# Patient Record
Sex: Male | Born: 1950
Health system: Southern US, Community
[De-identification: ages and names within clinical notes are randomized; demographics above are authoritative.]

## PROBLEM LIST (undated history)

## (undated) DIAGNOSIS — B1920 Unspecified viral hepatitis C without hepatic coma: Secondary | ICD-10-CM

---

## 1998-12-27 ENCOUNTER — Ambulatory Visit (HOSPITAL_COMMUNITY): Admission: RE | Admit: 1998-12-27 | Discharge: 1998-12-27 | Payer: Self-pay | Admitting: Gastroenterology

## 1998-12-27 ENCOUNTER — Encounter: Payer: Self-pay | Admitting: Gastroenterology

## 2000-06-03 ENCOUNTER — Other Ambulatory Visit: Admission: RE | Admit: 2000-06-03 | Discharge: 2000-06-03 | Payer: Self-pay | Admitting: Gastroenterology

## 2008-07-02 ENCOUNTER — Telehealth: Payer: Self-pay | Admitting: Gastroenterology

## 2010-09-26 ENCOUNTER — Other Ambulatory Visit: Payer: Self-pay | Admitting: Family Medicine

## 2010-09-26 DIAGNOSIS — R079 Chest pain, unspecified: Secondary | ICD-10-CM

## 2010-09-28 ENCOUNTER — Ambulatory Visit
Admission: RE | Admit: 2010-09-28 | Discharge: 2010-09-28 | Disposition: A | Discharge status: Home / Self Care | Payer: No Typology Code available for payment source | Source: Ambulatory Visit | Attending: Family Medicine | Admitting: Family Medicine

## 2010-09-28 DIAGNOSIS — R079 Chest pain, unspecified: Secondary | ICD-10-CM

## 2010-09-28 MED ORDER — IOHEXOL 300 MG/ML  SOLN
100.0000 mL | Freq: Once | INTRAMUSCULAR | Status: AC | PRN
  Administered 2010-09-28: 100 mL via INTRAVENOUS

## 2012-05-07 HISTORY — PX: SHOULDER ARTHROSCOPY WITH SUBACROMIAL DECOMPRESSION: SHX5684

## 2015-02-28 DIAGNOSIS — M7710 Lateral epicondylitis, unspecified elbow: Secondary | ICD-10-CM | POA: Diagnosis not present

## 2015-02-28 DIAGNOSIS — Z23 Encounter for immunization: Secondary | ICD-10-CM | POA: Diagnosis not present

## 2015-02-28 DIAGNOSIS — B1920 Unspecified viral hepatitis C without hepatic coma: Secondary | ICD-10-CM | POA: Diagnosis not present

## 2015-02-28 DIAGNOSIS — R252 Cramp and spasm: Secondary | ICD-10-CM | POA: Diagnosis not present

## 2015-02-28 DIAGNOSIS — M2392 Unspecified internal derangement of left knee: Secondary | ICD-10-CM | POA: Diagnosis not present

## 2015-02-28 DIAGNOSIS — H578 Other specified disorders of eye and adnexa: Secondary | ICD-10-CM | POA: Diagnosis not present

## 2015-03-01 ENCOUNTER — Other Ambulatory Visit (HOSPITAL_COMMUNITY): Payer: Self-pay | Admitting: Gastroenterology

## 2015-03-01 DIAGNOSIS — B1920 Unspecified viral hepatitis C without hepatic coma: Secondary | ICD-10-CM

## 2015-03-07 DIAGNOSIS — M25562 Pain in left knee: Secondary | ICD-10-CM | POA: Diagnosis not present

## 2015-03-07 DIAGNOSIS — M2242 Chondromalacia patellae, left knee: Secondary | ICD-10-CM | POA: Diagnosis not present

## 2015-03-24 ENCOUNTER — Ambulatory Visit (HOSPITAL_COMMUNITY): Payer: Self-pay

## 2015-04-07 ENCOUNTER — Other Ambulatory Visit (HOSPITAL_COMMUNITY): Payer: Self-pay | Admitting: Gastroenterology

## 2015-04-07 DIAGNOSIS — B182 Chronic viral hepatitis C: Secondary | ICD-10-CM

## 2015-04-07 DIAGNOSIS — Z1211 Encounter for screening for malignant neoplasm of colon: Secondary | ICD-10-CM | POA: Diagnosis not present

## 2015-04-07 DIAGNOSIS — B1920 Unspecified viral hepatitis C without hepatic coma: Secondary | ICD-10-CM | POA: Diagnosis not present

## 2015-04-26 DIAGNOSIS — D1801 Hemangioma of skin and subcutaneous tissue: Secondary | ICD-10-CM | POA: Diagnosis not present

## 2015-04-26 DIAGNOSIS — L820 Inflamed seborrheic keratosis: Secondary | ICD-10-CM | POA: Diagnosis not present

## 2015-04-28 ENCOUNTER — Ambulatory Visit (HOSPITAL_COMMUNITY)
Admission: RE | Admit: 2015-04-28 | Discharge: 2015-04-28 | Disposition: A | Discharge status: Home / Self Care | Payer: Medicare Other | Source: Ambulatory Visit | Attending: Gastroenterology | Admitting: Gastroenterology

## 2015-04-28 DIAGNOSIS — B182 Chronic viral hepatitis C: Secondary | ICD-10-CM | POA: Insufficient documentation

## 2015-04-28 DIAGNOSIS — K7689 Other specified diseases of liver: Secondary | ICD-10-CM | POA: Insufficient documentation

## 2015-04-28 DIAGNOSIS — B1920 Unspecified viral hepatitis C without hepatic coma: Secondary | ICD-10-CM | POA: Diagnosis not present

## 2015-04-28 DIAGNOSIS — N281 Cyst of kidney, acquired: Secondary | ICD-10-CM | POA: Insufficient documentation

## 2016-12-05 IMAGING — US US ABDOMEN COMPLETE W/ ELASTOGRAPHY
1 series · 12 of 19 positions shown · non-contrast
Comparison: None.

CLINICAL DATA: Hepatitis C, treated with interferon



[Series 1: us abdomen complete w/ elastography · 0.20mm/px · 12 of 19 slices shown]
[im 1/19]
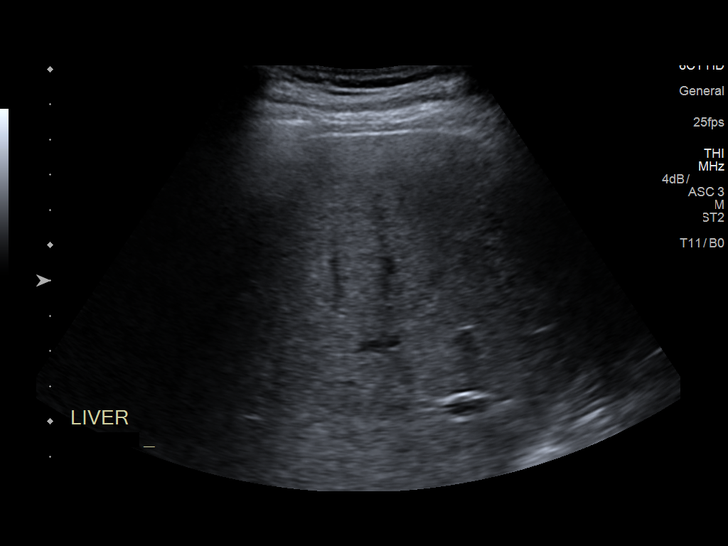
[im 3/19]
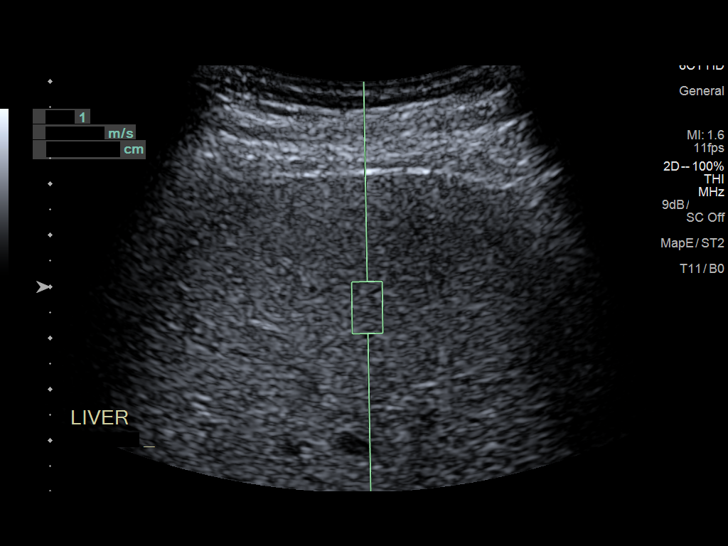
[im 4/19]
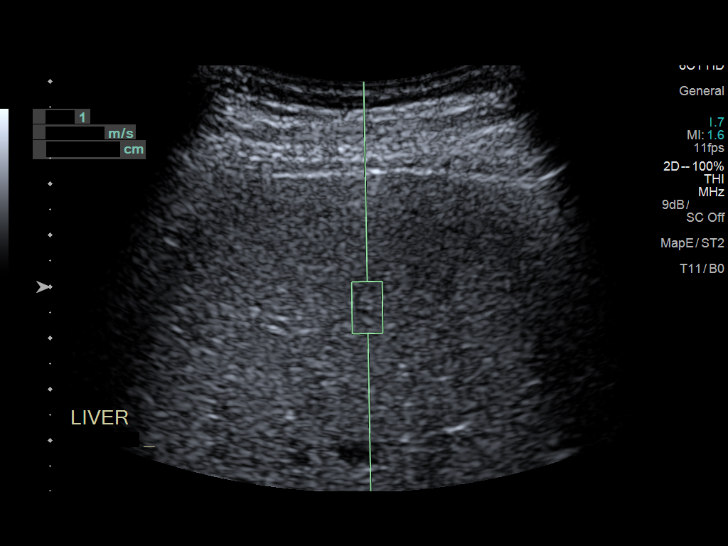
[im 6/19]
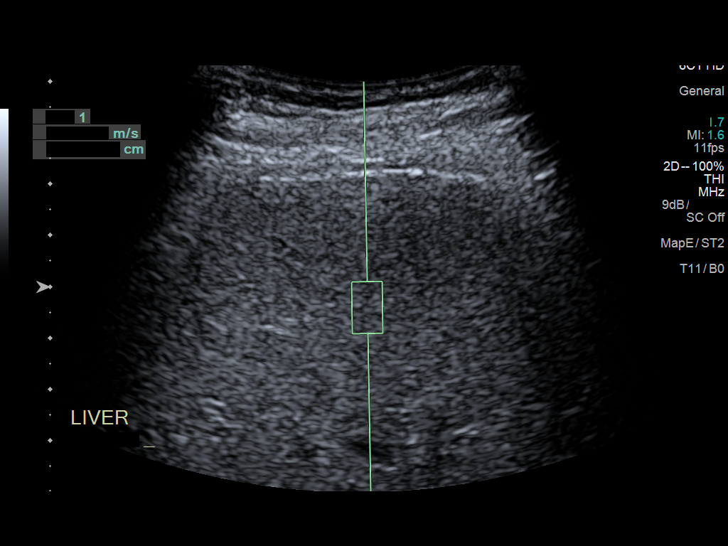
[im 8/19]
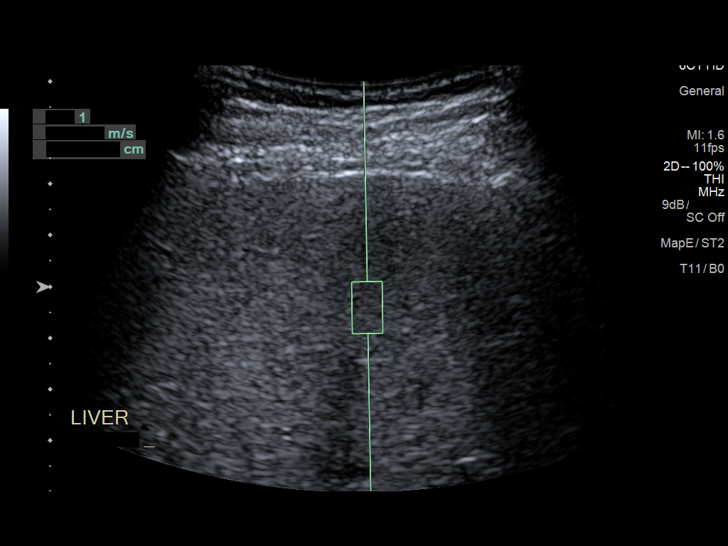
[im 9/19]
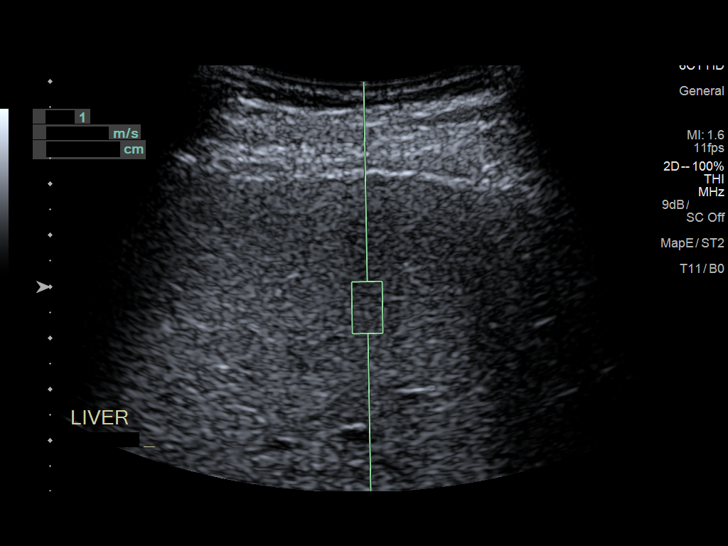
[im 11/19]
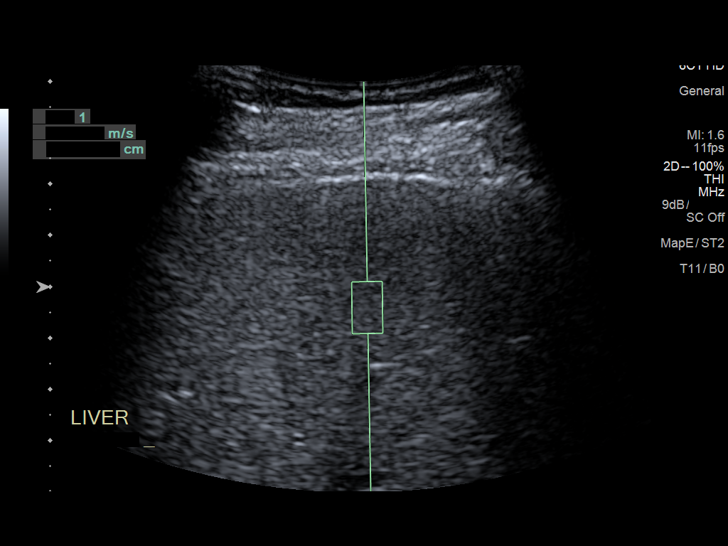
[im 12/19]
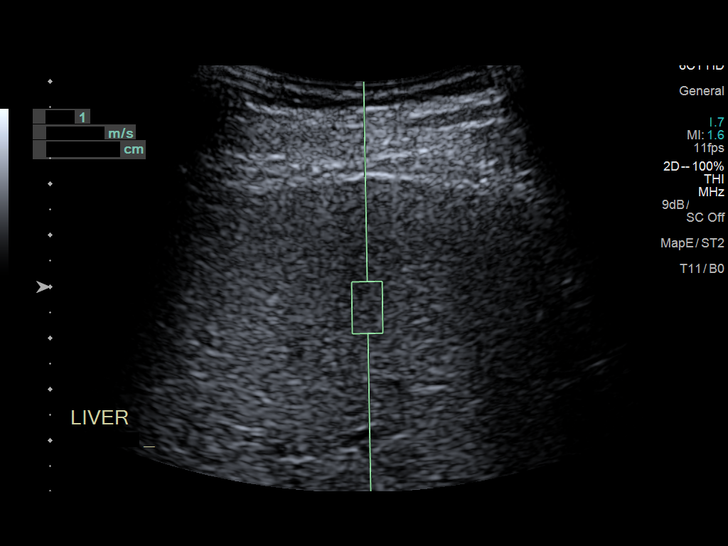
[im 14/19]
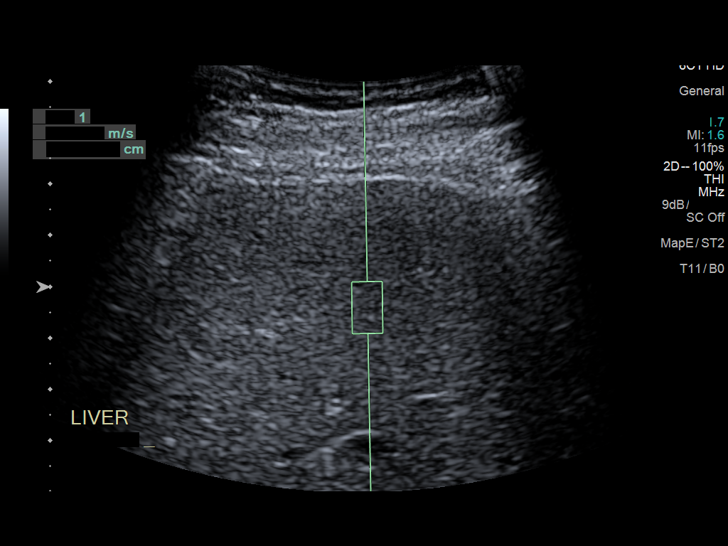
[im 16/19]
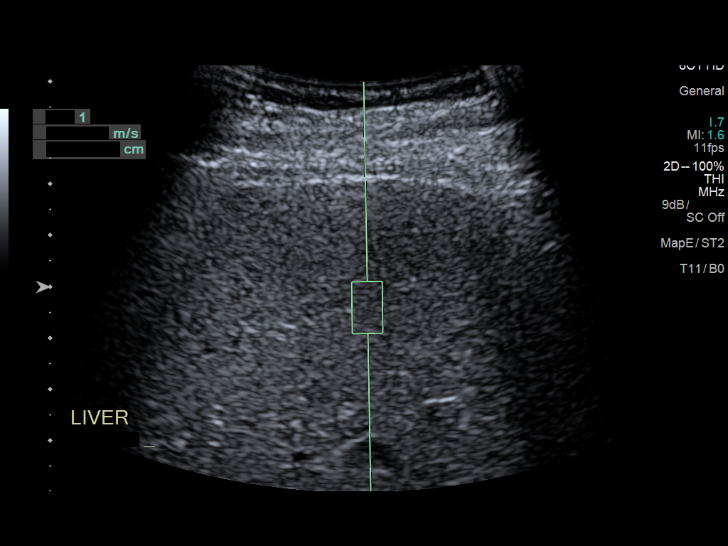
[im 17/19]
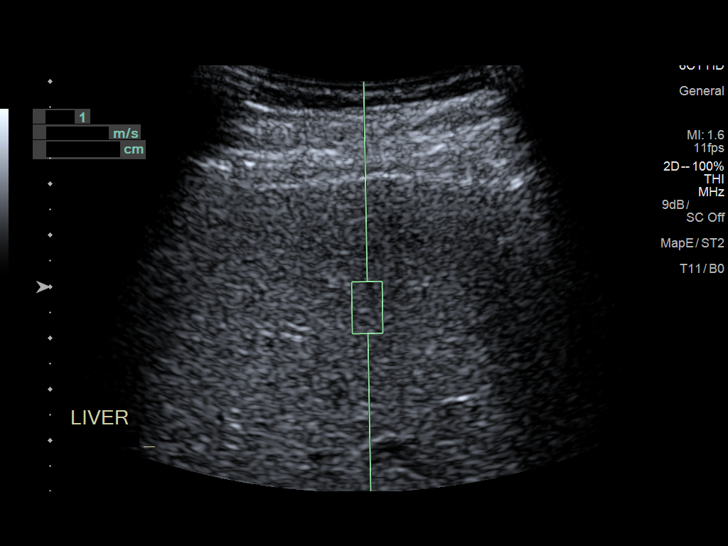
[im 19/19]
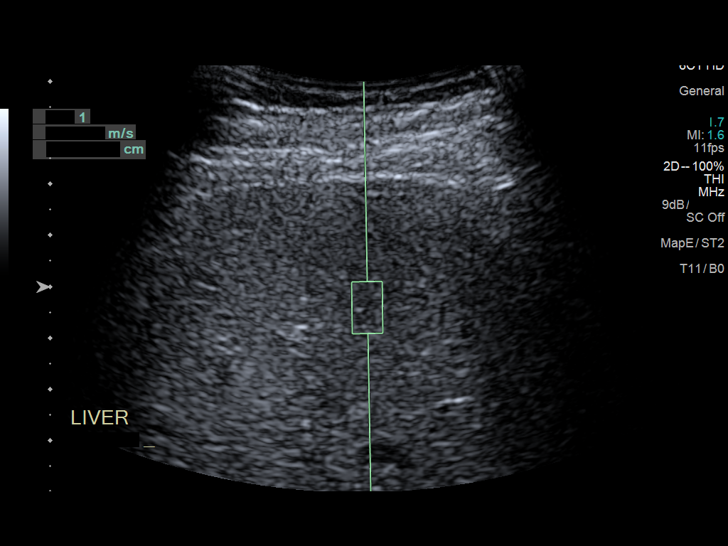

[12 of 19 positions shown; findings below may reference images not displayed]

FINDINGS: ULTRASOUND ABDOMEN

Gallbladder: No gallstones, gallbladder wall thickening, or
pericholecystic fluid. Negative sonographic Murphy's sign.

Common bile duct: Diameter: 5 mm

Liver: Multiple hepatic cysts measuring up to 1.6 cm. 1.1 x 1.0 x
1.1 cm echogenic lesion in the right hepatic lobe, favored to
reflect a benign hemangioma. Coarse hepatic echotexture.

IVC: No abnormality visualized.

Pancreas: Visualized portion unremarkable.

Spleen: Size and appearance within normal limits.

Right Kidney: Length: 12.7 cm. 3.4 x 2.2 x 2.3 cm simple upper pole
renal cyst. No hydronephrosis.

Left Kidney: Length: 13.1 cm. No mass or hydronephrosis.

Abdominal aorta: Mild ectasia proximally, measuring 3.3 cm.

Other findings: None.

ULTRASOUND HEPATIC ELASTOGRAPHY

Device: Siemens Helix VTQ

Patient position: Left lateral decubitus

Transducer 4V1

Number of measurements:  10

Hepatic Segment:  8

Median velocity:   2.30  m/sec

IQR:

IQR/Median velocity ratio

Corresponding Metavir fibrosis score:  F3/F4

Risk of fibrosis: High

Limitations of exam: None

Pertinent findings noted on other imaging exams:  None

Please note that abnormal shear wave velocities may also be
identified in clinical settings other than with hepatic fibrosis,
such as: acute hepatitis, elevated right heart and central venous
pressures including use of beta blockers, Tc Orhan disease
(Mitkness), infiltrative processes such as
mastocytosis/amyloidosis/infiltrative tumor, extrahepatic
cholestasis, in the post-prandial state, and liver transplantation.
Correlation with patient history, laboratory data, and clinical
condition recommended.
IMPRESSION: Coarse hepatic echotexture. Scattered hepatic cysts measuring up to
1.6 cm. 1.1 cm echogenic lesion in the right hepatic lobe, favored
to reflect a benign hemangioma on ultrasound.

3.4 cm simple right upper pole renal cyst.

Possible 3.3 cm abdominal aortic aneurysm. Recommend followup by
ultrasound in 3 years. This recommendation follows ACR consensus
guidelines: White Paper of the ACR Incidental Findings Committee II
on Vascular Findings. [HOSPITAL] 8486; [DATE]

Median hepatic shear wave velocity is calculated at 2.30 m/sec.

Corresponding Metavir fibrosis score is F3/F4.

Risk of fibrosis is high.

Follow-up:  Follow-up advised.

## 2017-08-07 ENCOUNTER — Other Ambulatory Visit (HOSPITAL_BASED_OUTPATIENT_CLINIC_OR_DEPARTMENT_OTHER): Payer: Self-pay | Admitting: Family Medicine

## 2017-08-07 DIAGNOSIS — R1011 Right upper quadrant pain: Secondary | ICD-10-CM

## 2017-08-08 ENCOUNTER — Encounter (HOSPITAL_BASED_OUTPATIENT_CLINIC_OR_DEPARTMENT_OTHER): Payer: Self-pay

## 2017-08-08 ENCOUNTER — Other Ambulatory Visit: Payer: Self-pay | Admitting: Gastroenterology

## 2017-08-08 ENCOUNTER — Ambulatory Visit (HOSPITAL_BASED_OUTPATIENT_CLINIC_OR_DEPARTMENT_OTHER)
Admission: RE | Admit: 2017-08-08 | Discharge: 2017-08-08 | Disposition: A | Discharge status: Home / Self Care | Payer: Medicare Other | Source: Ambulatory Visit | Attending: Family Medicine | Admitting: Family Medicine

## 2017-08-08 DIAGNOSIS — R188 Other ascites: Secondary | ICD-10-CM | POA: Insufficient documentation

## 2017-08-08 DIAGNOSIS — K769 Liver disease, unspecified: Secondary | ICD-10-CM | POA: Diagnosis not present

## 2017-08-08 DIAGNOSIS — R935 Abnormal findings on diagnostic imaging of other abdominal regions, including retroperitoneum: Secondary | ICD-10-CM

## 2017-08-08 DIAGNOSIS — R1011 Right upper quadrant pain: Secondary | ICD-10-CM | POA: Diagnosis not present

## 2017-08-08 HISTORY — DX: Unspecified viral hepatitis C without hepatic coma: B19.20

## 2017-08-10 ENCOUNTER — Ambulatory Visit
Admission: RE | Admit: 2017-08-10 | Discharge: 2017-08-10 | Disposition: A | Discharge status: Home / Self Care | Payer: Medicare Other | Source: Ambulatory Visit | Attending: Gastroenterology | Admitting: Gastroenterology

## 2017-08-10 DIAGNOSIS — R935 Abnormal findings on diagnostic imaging of other abdominal regions, including retroperitoneum: Secondary | ICD-10-CM

## 2017-08-10 MED ORDER — GADOBENATE DIMEGLUMINE 529 MG/ML IV SOLN
18.0000 mL | Freq: Once | INTRAVENOUS | Status: AC | PRN
  Administered 2017-08-10: 18 mL via INTRAVENOUS

## 2017-08-15 NOTE — Progress Notes (Signed)
Called patient to introduce myself and to confirm t/d/l of his new patient appointment on 08/16/17. Patient aware to arrive at 1:15 PM.

## 2017-08-16 ENCOUNTER — Encounter: Payer: Self-pay | Admitting: *Deleted

## 2017-08-16 ENCOUNTER — Inpatient Hospital Stay: Payer: Medicare Other | Attending: Nurse Practitioner | Admitting: Nurse Practitioner

## 2017-08-16 ENCOUNTER — Encounter: Payer: Self-pay | Admitting: Nurse Practitioner

## 2017-08-16 ENCOUNTER — Telehealth: Payer: Self-pay | Admitting: Nurse Practitioner

## 2017-08-16 VITALS — BP 144/101 | HR 104 | Temp 98.6°F | Resp 18 | Wt 187.5 lb

## 2017-08-16 DIAGNOSIS — C220 Liver cell carcinoma: Secondary | ICD-10-CM

## 2017-08-16 DIAGNOSIS — R0600 Dyspnea, unspecified: Secondary | ICD-10-CM | POA: Diagnosis not present

## 2017-08-16 DIAGNOSIS — I2699 Other pulmonary embolism without acute cor pulmonale: Secondary | ICD-10-CM | POA: Insufficient documentation

## 2017-08-16 DIAGNOSIS — G893 Neoplasm related pain (acute) (chronic): Secondary | ICD-10-CM

## 2017-08-16 DIAGNOSIS — I82220 Acute embolism and thrombosis of inferior vena cava: Secondary | ICD-10-CM | POA: Diagnosis not present

## 2017-08-16 DIAGNOSIS — B1920 Unspecified viral hepatitis C without hepatic coma: Secondary | ICD-10-CM | POA: Diagnosis not present

## 2017-08-16 DIAGNOSIS — J449 Chronic obstructive pulmonary disease, unspecified: Secondary | ICD-10-CM

## 2017-08-16 NOTE — Progress Notes (Signed)
Upon assessment of suicide risk pt answered "sure" to 'do you have any thoughts or harming yourself or others?' and to feeling hopeless. Pt further assessed and expressed, "from what I've heard this can't be cured. Its kind of like euthanasia, ending myself before it ends me". Provider made aware. Chaplain services and Social worker contacted and informed. Pt provided information and contact info for spiritual care and social work. Patient expressed gratitude for resources provided.

## 2017-08-16 NOTE — Progress Notes (Signed)
  Oncology Nurse Navigator Documentation  Navigator Location: [facility] (08/16/17 1501)   )Navigator Encounter Type: Initial MedOnc (08/16/17 1501)                         Barriers/Navigation Needs: No barriers at this time (08/16/17 1501)   Interventions: Psycho-social support (08/16/17 1501) Provided my contact information and encouraged patient to reach out with questions or concerns.            Acuity: Level 2 (08/16/17 1501)         Time Spent with Patient: 15 (08/16/17 1501)

## 2017-08-16 NOTE — Progress Notes (Signed)
[place] Work  Clinical Social Work was referred by medical oncology for assessment of psychosocial needs; specifically feeling hopeless per patient report.  Clinical Social Worker met with patient in exam room to offer support and assess for needs.   Wayne Rose shared he plans to put his trust in his medical team and pursue treatment, although it is his understanding they cannot cure his cancer.  The patient clearly reported he does not have active thoughts of hurting himself or others.  He shared he does have concerns of experiencing pain, difficulty breathing, and other symptoms related to his illness.  CSW explored these quality of life concerns with patient- discussed support of healthcare team to address quality of life issues medical oncology, supportive care team, and possibly palliative care team in the future.  Wayne Rose was very interested in multidisciplinary support and CSW reviewed programs available.  Patient strengths: Wayne Rose identifies himself as spiritual and has "spent years" developing his own spirituality to help guide him through difficult experiences.  The patient is in recovery and works at [employer] identified many coping skills that are effective for him. Patient barriers to care: The patient lives alone.  He has family in the area, but may be unavailable at times for support.    Maryjean Morn, MSW, LCSW, OSW-C Clinical Social Worker [facility] [phone]

## 2017-08-16 NOTE — Telephone Encounter (Signed)
Scheduled appt per 4/12 los - patient did not want avs or calendar.

## 2017-08-16 NOTE — Progress Notes (Addendum)
New Hematology/Oncology Consult   Referral MD: Dr. Paulita Fujita  [phone]      Reason for Referral: Hepatocellular carcinoma  HPI: Mr. Groleau is a 67 year old man with a history of HCV, status post treatment with Epclusa, referred for new diagnosis of hepatocellular carcinoma.  He developed nausea and shortness of breath several months ago.  About 1 month ago he noted right-sided abdominal pain.  He was seen by his PCP and referred for an abdominal ultrasound 08/08/2017 with findings of multiple mass lesions of varying echogenicity within the liver, the largest lying within the right lobe extending into the infrahepatic IVC.  AFP elevated at 225.9.  Abdominal MRI 08/10/2017 showed a 13.9 x 16.5 x 11.5 cm enhancing mass in the right hepatic lobe invading the intrahepatic portion of the inferior vena cava, tracking toward the right atrium with mild potential tumor thrombus in the right atrium.  Mass highly heterogeneous with central necrotic or scarlike elements with accentuated precontrast T1 signal as well as washout and capsule appearance.  Another focus of enhancing tumor in the lateral segment left hepatic lobe measuring 7.0 x 7.0 x 5.0 cm extending slightly through the posterior capsule to about the pancreatic margin.  Several additional satellite foci.  Cirrhosis.  Mild perihepatic ascites.     Past Medical History:  Diagnosis Date  . Hepatitis C    Treatment 2017- Cleared per patient  :   Past Surgical History:  Procedure Laterality Date  . SHOULDER ARTHROSCOPY WITH SUBACROMIAL DECOMPRESSION Left 2014   Allergies: Pistachios (hives)  Medication: Ibuprofen as needed  FH: Mother age 29 with no major health problems; father deceased with MI, heart failure; sister with stage IV lung cancer.  SOCIAL HISTORY: He lives in [place].  He is single.  No children.  He has multiple family members that live in town.  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  He smoked for  about 50 years.  He now has an occasional cigarette.  History of alcohol and drug use.  None for the past 37 years.  Review of Systems: He reports a good appetite.  No weight loss.  No fevers or sweats.  Intermittent right-sided abdominal pain relieved with ibuprofen.  Periodic headaches.  No diplopia.  Recent dyspnea on exertion.  He notes it is hard to take a deep breath.  Recent dry cough.  No chest pain.  He has bilateral ankle edema.  He is having multiple bowel movements a day, estimates 3 or 4, soft to watery.  No bleeding.  He has chronic slight numbness in both feet.  Physical Exam:  Blood pressure (!) 144/101, pulse (!) 104, temperature 98.6 F (37 C), temperature source Oral, resp. rate 18, weight 187 lb 8 oz (85 kg), SpO2 95 %.  HEENT: PERRLA.  Extraocular movements intact.  Sclera anicteric.  Oropharynx is without thrush or ulceration. Lungs: Breath sounds diminished at the lung bases.  Faint inspiratory rales both lung bases.  No respiratory distress. Cardiac: Regular rate and rhythm. Abdomen: Distended.  Fullness right mid upper abdominal region, question hepatomegaly.  Vascular: Trace bilateral ankle edema. Lymph nodes: No palpable cervical, supraclavicular, axillary or inguinal lymph nodes. Neurologic: Lungs clear bilaterally. Skin: Dry appearing mildly erythematous rash at the upper abdominal wall and bilateral lower legs.  LABS: Outside lab 08/02/2017-hemoglobin 16.8, white count 12.1, platelet count 497,000; 08/06/2017 BUN 29, creatinine 0.71, sodium 138, potassium 4.5, calcium 9.3, total protein 6.9, albumin 3.5, total bilirubin 1.1, alkaline phosphatase 137, AST 118,  ALT 100; 08/08/2017 AFP 225.9, CA-19-9 39, CEA 2.6  RADIOLOGY:  Mr Abdomen Wwo Contrast  Result Date: 08/11/2017 CLINICAL DATA:  Liver lesions shown on ultrasound, for further characterization. EXAM: MRI ABDOMEN WITHOUT AND WITH CONTRAST TECHNIQUE: Multiplanar multisequence MR imaging of the abdomen was performed  both before and after the administration of intravenous contrast. CONTRAST:  44mL MULTIHANCE GADOBENATE DIMEGLUMINE 529 MG/ML IV SOLN COMPARISON:  Multiple exams, including 08/08/2017 FINDINGS: Despite efforts by the technologist and patient, motion artifact is present on today's exam and could not be eliminated. This reduces exam sensitivity and specificity. Lower chest: Unremarkable Hepatobiliary: 13.9 by 16.5 by 11.5 cm arterial phase enhancing mass in the right hepatic lobe invades the intrahepatic portion of the inferior vena cava, tracking up towards the right atrium with potential mild tumor thrombus in the right atrium. This mass is highly heterogeneous, with central necrotic or scar like elements with accentuated precontrast T1 signal, as well as washout and capsule appearance, high likelihood of hepatocellular carcinoma. Another focus of similar arterial phase enhancing tumor in the lateral segment left hepatic lobe measures 7.0 by 7.0 by 5.0 cm in the left hepatic lobe. This extends slightly through the posterior capsule to abut the pancreatic margin for example on image 74/18. There are several additional satellite foci of arterial phase enhancement in the liver, for example a 1.2 cm focus on image 66/16 anteriorly in the right hepatic lobe, and another lesion in the caudate lobe, compatible with multifocal malignancy. There also scattered cysts in the liver as shown on the prior CT chest from 09/28/2010. Hepatic morphology suggestive of underlying cirrhosis. Pancreas:  No appreciable pancreatic mass. Spleen:  Unremarkable Adrenals/Urinary Tract: Right mid kidney simple appearing renal cyst. Small cyst of the left kidney lower pole. Symmetric appearing bilateral perirenal stranding. Stomach/Bowel: Unremarkable Vascular/Lymphatic: Portacaval lymph node 1.3 cm in short axis, image 82/16. Other:  Perihepatic ascites Musculoskeletal: Lower lumbar degenerative disc disease. IMPRESSION: 1. Cirrhosis with  multifocal hepatocellular carcinoma, dominant lesion in the right hepatic lobe 16.5 cm in diameter. The dominant lesion in invades the IVC, with tumor thrombus in the IVC extending up towards and potentially into the lower portion of the right atrium. If not already obtained, multi disciplinary consultation is recommended. 2. Mild perihepatic ascites. Electronically Signed   By: Van Clines M.D.   On: 08/11/2017 09:30   US Abdomen Complete  Result Date: 08/08/2017 CLINICAL DATA:  Right upper quadrant pain, history of hepatitis C EXAM: ABDOMEN ULTRASOUND COMPLETE COMPARISON:  Prior ultrasound from 04/28/2015. FINDINGS: Gallbladder: Gallbladder is well distended. Focal mildly echogenic areas are noted along the gallbladder wall consistent with gallbladder polyps. No calculi are identified. A small cystic structure is noted adjacent to the gallbladder. This is of uncertain significance but may represent a small outpouching from the gallbladder. No wall thickening or pericholecystic fluid is noted. This was not visualized on the prior ultrasound examination. Common bile duct: Diameter: 5.5 mm. Liver: Multiple lesions are noted throughout the liver. A few of these are small and hyperechoic suggesting hepatic hemangiomas. There are however multiple larger lesions of varying echogenicity. The largest of these lies in the right lobe of the liver measuring up to 17 cm. There is also echogenic material identified within the adjacent intrahepatic IVC. This may represent tumor ingrowth or bland thrombus. The main portal vein is patent although the right portal vein is not well visualized which may also be related to extrinsic compression or possibly thrombus. IVC: Echogenic material is noted within  the intrahepatic IVC as described above consistent with bland or tumor thrombus. The more inferior IVC is within normal limits. Pancreas: Visualized portion unremarkable. Spleen: Size and appearance within normal limits.  Splenule is noted in the splenic hilum. Right Kidney: Length: 12.2 cm. 3.1 cm cyst is noted stable from the prior exam. No obstructive changes are seen. Left Kidney: Length: 11.9 cm. Echogenicity within normal limits. No mass or hydronephrosis visualized. 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 This is stable from the prior study. Other findings: Mild ascites is noted in the right upper quadrant. IMPRESSION: Multiple mass lesions of varying echogenicity within the liver the largest of which lies within the right lobe in appears to extend into the infrahepatic IVC. Further evaluation by means of contrast enhanced MRI is recommended. Cystic area adjacent to the gallbladder which may represent a small outpouching from the gallbladder. This may represent a small collection of ascitic fluid given the ascites. Mild ascites in the right upper quadrant. Chronic changes as described above. These results will be called to the ordering clinician or representative by the Radiologist Assistant, and communication documented in the PACS or zVision Dashboard. Electronically Signed   By: Inez Catalina M.D.   On: 08/08/2017 10:02    Assessment:   1. Multifocal hepatocellular carcinoma  Abdominal ultrasound 08/08/2017- multiple mass lesions of varying echogenicity within the liver with the largest within the right lobe appearing to extend into the intrahepatic IVC.  Mild ascites right upper quadrant.  08/08/2017 AFP elevated 225.9  MRI abdomen 08/11/2017- cirrhosis with multifocal hepatocellular carcinoma, dominant lesion right hepatic lobe 16.5 cm.  Dominant lesion invades the IVC with tumor thrombus in the IVC extending up toward and potentially into the lower portion of the right atrium.  Mild perihepatic ascites. 2. Hepatitis C status post treatment with Epclusa 3. Exertional dyspnea probably secondary to COPD, question HCC involving the lungs, question pleural  effusion 4. Abdominal pain secondary to #1  Disposition: Mr. Palmeri has been diagnosed with multifocal hepatocellular carcinoma with tumor thrombus in the IVC.  Dr. Benay Spice reviewed the diagnosis, prognosis and treatment options.  He understands that no therapy will be curative.  We are referring him for staging CT scans.  His case will be presented at the upcoming GI tumor conference.  Dr. Benay Spice discussed treatment options including sorafenib, possible immunotherapy in the future, referral for a clinical trial.  He is interested in a trial of sorafenib.  We reviewed potential toxicities including bone marrow toxicity, nausea, mouth sores, diarrhea, rash, hepatitis, hypertension, increased risk of blood clots.  He will attend a chemotherapy education class.  He will return for a follow-up visit on 08/22/2017 for additional discussion/establishment of a treatment plan.  He will contact the office in the interim with any problems.  Patient seen with Dr. Benay Spice.  MRI images reviewed on the computer with Mr. Sansom. 60 minutes were spent face-to-face at today's visit with the majority of that time involved in counseling/coordination of care.   Ned Card, NP 08/16/2017, 1:37 PM   This was a shared visit with Ned Card.  Mr.Pangelinan reviewed and examined.  He has been diagnosed with advanced stage hepatocellular carcinoma in the setting of cirrhosis.  He is not a candidate for surgery.  He is also likely not a candidate for hepatic directed therapy.  We discussed systemic treatment options with him including sorafenib, immunotherapy, and clinical trials.  He does not wish to consider a clinical  trial at present. He will undergo additional staging and return for an office visit next week.  We will recommend first-line therapy with sorafenib.  We reviewed potential toxicities associated with sorafenib.  The dyspnea is likely secondary to COPD, respiratory restriction from the tumor burden, and  potentially metastatic disease involving the chest.  I have a low clinical suspicion for pulmonary embolism.  He will be referred for a chest CT next week.  I will present his case at the GI tumor conference on 08/21/2017.  Julieanne Manson, MD

## 2017-08-19 ENCOUNTER — Telehealth: Payer: Self-pay | Admitting: Pharmacy Technician

## 2017-08-19 NOTE — Telephone Encounter (Signed)
Oral Oncology Patient Advocate Encounter  Received notification from [facility] that prior authorization for Nexavar is required.  PA submitted on CoverMyMeds Key [id number] Status is pending  Oral Oncology Clinic will continue to follow.  Wayne Rose. [name], [name] [name] [phone] 08/19/2017 2:16 PM

## 2017-08-19 NOTE — Telephone Encounter (Signed)
Oral Oncology Patient Advocate Encounter  Prior Authorization for Nexavar has been approved.    PA# [id number] Effective dates: 08/19/2017 through 05/06/2018  Oral Oncology Clinic will continue to follow.   Fabio Asa. [name], [place] Patient [place] [phone] 08/19/2017 2:31 PM

## 2017-08-20 ENCOUNTER — Encounter (HOSPITAL_COMMUNITY): Payer: Self-pay

## 2017-08-20 ENCOUNTER — Ambulatory Visit (HOSPITAL_COMMUNITY)
Admission: RE | Admit: 2017-08-20 | Discharge: 2017-08-20 | Disposition: A | Discharge status: Home / Self Care | Payer: Medicare Other | Source: Ambulatory Visit | Attending: Nurse Practitioner | Admitting: Nurse Practitioner

## 2017-08-20 ENCOUNTER — Inpatient Hospital Stay: Payer: Medicare Other

## 2017-08-20 DIAGNOSIS — R188 Other ascites: Secondary | ICD-10-CM | POA: Diagnosis not present

## 2017-08-20 DIAGNOSIS — C220 Liver cell carcinoma: Secondary | ICD-10-CM

## 2017-08-20 DIAGNOSIS — I2510 Atherosclerotic heart disease of native coronary artery without angina pectoris: Secondary | ICD-10-CM | POA: Insufficient documentation

## 2017-08-20 DIAGNOSIS — I700 Atherosclerosis of aorta: Secondary | ICD-10-CM | POA: Insufficient documentation

## 2017-08-20 DIAGNOSIS — I2699 Other pulmonary embolism without acute cor pulmonale: Secondary | ICD-10-CM | POA: Insufficient documentation

## 2017-08-20 LAB — CMP (CANCER CENTER ONLY)
ALBUMIN: 2.7 g/dL — AB (ref 3.5–5.0)
ALT: 82 U/L — ABNORMAL HIGH (ref 0–55)
ANION GAP: 7 (ref 3–11)
AST: 102 U/L — ABNORMAL HIGH (ref 5–34)
Alkaline Phosphatase: 174 U/L — ABNORMAL HIGH (ref 40–150)
BUN: 18 mg/dL (ref 7–26)
CALCIUM: 9.2 mg/dL (ref 8.4–10.4)
CO2: 29 mmol/L (ref 22–29)
Chloride: 104 mmol/L (ref 98–109)
Creatinine: 0.75 mg/dL (ref 0.70–1.30)
GFR, Estimated: >60 mL/min (ref >60)
Glucose, Bld: 120 mg/dL (ref 70–140)
POTASSIUM: 3.9 mmol/L (ref 3.5–5.1)
SODIUM: 140 mmol/L (ref 136–145)
TOTAL PROTEIN: 6.9 g/dL (ref 6.4–8.3)
Total Bilirubin: 0.8 mg/dL (ref 0.2–1.2)

## 2017-08-20 LAB — CBC WITH DIFFERENTIAL (CANCER CENTER ONLY)
BASOS ABS: 0.1 10*3/uL (ref 0.0–0.1)
BASOS PCT: 1 %
Eosinophils Absolute: 0.1 10*3/uL (ref 0.0–0.5)
Eosinophils Relative: 1 %
HEMATOCRIT: 48.3 % (ref 38.4–49.9)
HEMOGLOBIN: 15.6 g/dL (ref 13.0–17.1)
LYMPHS PCT: 12 %
Lymphs Abs: 0.9 10*3/uL (ref 0.9–3.3)
MCH: 26.1 pg — ABNORMAL LOW (ref 27.2–33.4)
MCHC: 32.4 g/dL (ref 32.0–36.0)
MCV: 80.5 fL (ref 79.3–98.0)
MONOS PCT: 11 %
Monocytes Absolute: 0.9 10*3/uL (ref 0.1–0.9)
NEUTROS ABS: 5.8 10*3/uL (ref 1.5–6.5)
NEUTROS PCT: 75 %
Platelet Count: 225 10*3/uL (ref 140–400)
RBC: 6 MIL/uL — ABNORMAL HIGH (ref 4.20–5.82)
RDW: 17.2 % — ABNORMAL HIGH (ref 11.0–14.6)
WBC Count: 7.8 10*3/uL (ref 4.0–10.3)

## 2017-08-20 MED ORDER — IOHEXOL 300 MG/ML  SOLN
100.0000 mL | Freq: Once | INTRAMUSCULAR | Status: AC | PRN
  Administered 2017-08-20: 100 mL via INTRAVENOUS

## 2017-08-21 ENCOUNTER — Inpatient Hospital Stay (HOSPITAL_BASED_OUTPATIENT_CLINIC_OR_DEPARTMENT_OTHER): Payer: Medicare Other | Admitting: Nurse Practitioner

## 2017-08-21 ENCOUNTER — Telehealth: Payer: Self-pay

## 2017-08-21 ENCOUNTER — Telehealth: Payer: Self-pay | Admitting: Pharmacist

## 2017-08-21 ENCOUNTER — Encounter: Payer: Self-pay | Admitting: Nurse Practitioner

## 2017-08-21 VITALS — BP 148/98 | HR 86 | Temp 97.5°F | Resp 18 | Wt 188.7 lb

## 2017-08-21 DIAGNOSIS — R599 Enlarged lymph nodes, unspecified: Secondary | ICD-10-CM | POA: Diagnosis not present

## 2017-08-21 DIAGNOSIS — K7460 Unspecified cirrhosis of liver: Secondary | ICD-10-CM

## 2017-08-21 DIAGNOSIS — R609 Edema, unspecified: Secondary | ICD-10-CM

## 2017-08-21 DIAGNOSIS — R918 Other nonspecific abnormal finding of lung field: Secondary | ICD-10-CM | POA: Diagnosis not present

## 2017-08-21 DIAGNOSIS — C220 Liver cell carcinoma: Secondary | ICD-10-CM

## 2017-08-21 DIAGNOSIS — G893 Neoplasm related pain (acute) (chronic): Secondary | ICD-10-CM

## 2017-08-21 DIAGNOSIS — R05 Cough: Secondary | ICD-10-CM

## 2017-08-21 DIAGNOSIS — J449 Chronic obstructive pulmonary disease, unspecified: Secondary | ICD-10-CM

## 2017-08-21 DIAGNOSIS — R188 Other ascites: Secondary | ICD-10-CM

## 2017-08-21 DIAGNOSIS — I2699 Other pulmonary embolism without acute cor pulmonale: Secondary | ICD-10-CM | POA: Diagnosis not present

## 2017-08-21 DIAGNOSIS — I2609 Other pulmonary embolism with acute cor pulmonale: Secondary | ICD-10-CM

## 2017-08-21 MED ORDER — ENOXAPARIN SODIUM 80 MG/0.8ML ~~LOC~~ SOLN
80.0000 mg | Freq: Once | SUBCUTANEOUS | Status: AC
  Administered 2017-08-21: 13:00:00 80 mg via SUBCUTANEOUS
  Filled 2017-08-21: qty 0.8

## 2017-08-21 MED ORDER — ENOXAPARIN SODIUM 100 MG/ML ~~LOC~~ SOLN: 1.0000 mg/kg | Freq: Once | SUBCUTANEOUS | Status: DC

## 2017-08-21 MED ORDER — SORAFENIB TOSYLATE 200 MG PO TABS: ORAL_TABLET | ORAL | 0 refills | Status: DC

## 2017-08-21 MED ORDER — ENOXAPARIN SODIUM 80 MG/0.8ML ~~LOC~~ SOLN: 80.0000 mg | Freq: Two times a day (BID) | SUBCUTANEOUS | 3 refills | Status: DC

## 2017-08-21 MED ORDER — ENOXAPARIN SODIUM 80 MG/0.8ML ~~LOC~~ SOLN: 1.0000 mg/kg | Freq: Two times a day (BID) | SUBCUTANEOUS | 3 refills | Status: DC

## 2017-08-21 MED FILL — ENOXAPARIN 80 MG/0.8 ML SYR: 80 | 30 days supply | Qty: 48 | Fill #0

## 2017-08-21 NOTE — Telephone Encounter (Signed)
Oral Chemotherapy Pharmacist Encounter   I spoke with patient in exam room for overview of: Nexavar (sorafenib).   Counseled patient on administration, dosing, side effects, monitoring, drug-food interactions, safe handling, storage, and disposal.  Nexavar will be initiated on a dose titration schedule with planned dose of 400mg  BID.  Patient will take Nexavar 200mg  tablets, 1 tablet (200mg ) by mouth 2 times daily on an empty stomach, 1 hour before or 2 hours after meals for 7 days.  If tolerated, patient will increase dose to Nexavar 200mg  tablets, 2 tablets (400mg ) by in the AM and 1 tablets (200mg ) in the PM, on an empty stomach for 7 days.  If tolerated, patient will increase to dose to Nexavar 200mg  tablets, 2 tablets (400mg ) by mouth 2 times daily on an empty stomach, onward.  Patient will separate Nexavar dosing by 10-12 hours each day.  Nexavar start date: TBD, pending medication acquisition  Side effects include but not limited to: hypertension, fatigue, hand-foot syndrome, skin rash, diarrhea, nausea, anorexia, lab abnormalities, cardiac conduction changes, hypothyroidism, and wound healing complications.  Patient states he already experiences many of the possible side effects such as diarrhea, abdominal pain, fatigue, and nausea.  Patient has also already noted some skin changes that he attributed to dry skin.  Patient will obtain loperamide for at home use in case diarrhea worsens. Patient counseled to obtain some urea cream in case further skin changes occur with medication.  Reviewed with patient importance of keeping a medication schedule and plan for any missed doses.  Mr. Hessling voiced understanding and appreciation.   All questions answered. Medication reconciliation performed and medication/allergy list updated.  Discussed with patient high copayment for Nexavar and possible avenues for copayment support. No copayment assistance grants available for patient's  diagnosis at this time. Discussed with patient manufacturer assistance process. Patient signed manufacturer assistance application for [employer] [facility] program) and will bring financial documentation back to the office this afternoon. Patient is application for medication through the manufacturer will be updated in a separate encounter.  Patient knows to call the office with questions or concerns. Oral Oncology Clinic will continue to follow.  Thank you,  Johny Drilling, PharmD, BCPS, BCOP 08/21/2017   1:17 PM Oral Oncology Clinic [phone]

## 2017-08-21 NOTE — Telephone Encounter (Signed)
Oral Oncology Pharmacist Encounter  Received new prescription for Nexavar (sorafenib) for the treatment of newly diagnosed, advanced hepatocellular carcinoma, planned duration until disease progression or unacceptable toxicity.  Labs from 08/20/2017 assessed, okay for treatment. No baseline magnesium, phosphorus, lipase, or amylase These will be assessed at next lab visit  Only one measure of blood pressure in epic, elevated Risk of increased blood pressure will be discussed with MD and patient  Current medication list in Epic reviewed, no DDIs with Nexavar and Lovenox identified.  Prescription has been e-scribed to the [facility] for benefits analysis and approval. Insurance authorization was approved Test claim at the pharmacy revealed copayment of [phone] Oral oncology clinic will work with the patient to try to obtain medication from the manufacturer through compassionate use program.  Oral Oncology Clinic will continue to follow for initial counseling and start date.  Johny Drilling, PharmD, BCPS, BCOP 08/21/2017 12:19 PM Oral Oncology Clinic [phone]

## 2017-08-21 NOTE — Progress Notes (Addendum)
[facility] OFFICE PROGRESS NOTE   Diagnosis: Hepatocellular carcinoma  INTERVAL HISTORY:   Wayne Rose returns prior to scheduled follow-up to finding of bilateral pulmonary emboli on CT scan done yesterday.  He continues to have dyspnea.  He thinks the cough may be increased.  He continues to note edema from the waist down.  He denies any bleeding.  Objective:  Vital signs in last 24 hours:  Blood pressure (!) 148/98, pulse 86, temperature (!) 97.5 F (36.4 C), temperature source Oral, resp. rate 18, weight 188 lb 11.2 oz (85.6 kg), SpO2 95 %.    HEENT: No thrush or ulcers. Resp: Rales at the left lung base.  Diminished breath sounds both lung bases.  No respiratory distress. Cardio: Regular rate and rhythm. GI: Fullness right upper abdomen, question hepatomegaly. Vascular: Trace edema at the lower legs/ankles bilaterally.   Lab Results:  Lab Results  Component Value Date   WBC 7.8 08/20/2017   HCT 48.3 08/20/2017   MCV 80.5 08/20/2017   PLT 225 08/20/2017   NEUTROABS 5.8 08/20/2017    Imaging:  Ct Chest W Contrast  Result Date: 08/21/2017 CLINICAL DATA:  History of hepatocellular carcinoma diagnosed earlier this month. Occasional chest pain. Cough and shortness of breath for 2 months. Diffuse abdominal pain. Nausea and diarrhea. EXAM: CT CHEST, ABDOMEN, AND PELVIS WITH CONTRAST TECHNIQUE: Multidetector CT imaging of the chest, abdomen and pelvis was performed following the standard protocol during bolus administration of intravenous contrast. CONTRAST:  176m OMNIPAQUE IOHEXOL 300 MG/ML  SOLN COMPARISON:  Abdominal MRI of 08/10/2017. Chest radiograph of 08/02/2017. Most recent chest CT 09/28/2010. FINDINGS: CT CHEST FINDINGS Cardiovascular: Aortic atherosclerosis. Tortuous thoracic aorta. Normal heart size, without pericardial effusion. Lad coronary artery atherosclerosis. Mild thrombus extension in the right atrium, including at 1.6 cm on image 80/7.  Bilateral lower lobe pulmonary emboli, including near occlusive left-sided thrombus on 56/7 and right lower lobe lobar to segmental thrombus on image 68/7. Mediastinum/Nodes: No supraclavicular adenopathy. Prevascular adenopathy at 1.7 by 2.5 cm on image 46/7. Borderline enlarged right hilar node of 1.3 cm on image 55/7. Lungs/Pleura: No pleural fluid. Bilateral pulmonary nodules. Example at 6 mm in the right lower lobe on image 109/11 and at 5 mm in the left lower lobe on image 113/11. Underlying pattern of relatively diffuse micronodularity, felt to be slightly upper lobe predominant. An 8 mm density in the superior segment left lower lobe dependently on 77/11 could represent atelectasis or another pulmonary nodule. Musculoskeletal: No acute osseous abnormality. CT ABDOMEN PELVIS FINDINGS Hepatobiliary: Mild cirrhosis. Multifocal right greater than left hepatocellular carcinoma again identified. Hyperenhancing right-sided mass measures on the order of 16.5 x 13.3 cm on 30:2. Similar to the prior MRI. Dominant left-sided hyperenhancing mass measures 6.6 by 6.9 cm on image 57/2 and is felt to be similar to on the prior MRI. Tumor thrombus extends into the IVC, including on image 89/7 and coronal image 48/8. The right hepatic vein is also involved with tumor. Normal gallbladder, without biliary ductal dilatation. Pancreas: Normal, without mass or ductal dilatation. Spleen: Old granulomatous disease within. Adrenals/Urinary Tract: Mild left adrenal thickening. Normal right adrenal gland. Interpolar 2.7 cm right renal cyst. Left renal too small to characterize lesions. No hydronephrosis. Normal urinary bladder. Stomach/Bowel: Normal stomach, without wall thickening. Extensive colonic diverticulosis. Normal terminal ileum and appendix. Normal small bowel. Vascular/Lymphatic: Advanced aortic and branch vessel atherosclerosis. No abdominopelvic adenopathy. Reproductive: Normal prostate. Other: Small volume abdominopelvic  ascites, similar to the prior MRI.  No free intraperitoneal air. Musculoskeletal: Degenerate changes of both hips. Sclerotic lesions in the pelvis are most likely bone islands. Degenerative disc disease at L4-5. IMPRESSION: 1. Multifocal hepatocellular carcinoma. Right-sided tumor extension into the IVC and inferior right atrium, as on MRI. 2. Moderate to large volume bilateral pulmonary emboli. 3. Thoracic adenopathy, highly suspicious for metastatic disease. 4. Bilateral pulmonary nodules are nonspecific. Cannot exclude pulmonary metastasis. Underlying pattern of micronodularity could represent a concurrent atypical infectious process or simply be the sequelae of smoking. 5. Similar small volume abdominopelvic ascites. 6. Coronary artery atherosclerosis. Aortic Atherosclerosis (ICD10-I70.0). Case was preliminarily available and discussed at GI oncology conference (including pulmonary emboli), with Dr. Ammie Dalton, on the morning of 08/21/2017 at approximately 8:30 a.m. Electronically Signed   By: Abigail Miyamoto M.D.   On: 08/21/2017 09:13   Ct Abdomen Pelvis W Contrast  Result Date: 08/21/2017 CLINICAL DATA:  History of hepatocellular carcinoma diagnosed earlier this month. Occasional chest pain. Cough and shortness of breath for 2 months. Diffuse abdominal pain. Nausea and diarrhea. EXAM: CT CHEST, ABDOMEN, AND PELVIS WITH CONTRAST TECHNIQUE: Multidetector CT imaging of the chest, abdomen and pelvis was performed following the standard protocol during bolus administration of intravenous contrast. CONTRAST:  137m OMNIPAQUE IOHEXOL 300 MG/ML  SOLN COMPARISON:  Abdominal MRI of 08/10/2017. Chest radiograph of 08/02/2017. Most recent chest CT 09/28/2010. FINDINGS: CT CHEST FINDINGS Cardiovascular: Aortic atherosclerosis. Tortuous thoracic aorta. Normal heart size, without pericardial effusion. Lad coronary artery atherosclerosis. Mild thrombus extension in the right atrium, including at 1.6 cm on image 80/7. Bilateral  lower lobe pulmonary emboli, including near occlusive left-sided thrombus on 56/7 and right lower lobe lobar to segmental thrombus on image 68/7. Mediastinum/Nodes: No supraclavicular adenopathy. Prevascular adenopathy at 1.7 by 2.5 cm on image 46/7. Borderline enlarged right hilar node of 1.3 cm on image 55/7. Lungs/Pleura: No pleural fluid. Bilateral pulmonary nodules. Example at 6 mm in the right lower lobe on image 109/11 and at 5 mm in the left lower lobe on image 113/11. Underlying pattern of relatively diffuse micronodularity, felt to be slightly upper lobe predominant. An 8 mm density in the superior segment left lower lobe dependently on 77/11 could represent atelectasis or another pulmonary nodule. Musculoskeletal: No acute osseous abnormality. CT ABDOMEN PELVIS FINDINGS Hepatobiliary: Mild cirrhosis. Multifocal right greater than left hepatocellular carcinoma again identified. Hyperenhancing right-sided mass measures on the order of 16.5 x 13.3 cm on 30:2. Similar to the prior MRI. Dominant left-sided hyperenhancing mass measures 6.6 by 6.9 cm on image 57/2 and is felt to be similar to on the prior MRI. Tumor thrombus extends into the IVC, including on image 89/7 and coronal image 48/8. The right hepatic vein is also involved with tumor. Normal gallbladder, without biliary ductal dilatation. Pancreas: Normal, without mass or ductal dilatation. Spleen: Old granulomatous disease within. Adrenals/Urinary Tract: Mild left adrenal thickening. Normal right adrenal gland. Interpolar 2.7 cm right renal cyst. Left renal too small to characterize lesions. No hydronephrosis. Normal urinary bladder. Stomach/Bowel: Normal stomach, without wall thickening. Extensive colonic diverticulosis. Normal terminal ileum and appendix. Normal small bowel. Vascular/Lymphatic: Advanced aortic and branch vessel atherosclerosis. No abdominopelvic adenopathy. Reproductive: Normal prostate. Other: Small volume abdominopelvic ascites,  similar to the prior MRI. No free intraperitoneal air. Musculoskeletal: Degenerate changes of both hips. Sclerotic lesions in the pelvis are most likely bone islands. Degenerative disc disease at L4-5. IMPRESSION: 1. Multifocal hepatocellular carcinoma. Right-sided tumor extension into the IVC and inferior right atrium, as on MRI. 2. Moderate to large  volume bilateral pulmonary emboli. 3. Thoracic adenopathy, highly suspicious for metastatic disease. 4. Bilateral pulmonary nodules are nonspecific. Cannot exclude pulmonary metastasis. Underlying pattern of micronodularity could represent a concurrent atypical infectious process or simply be the sequelae of smoking. 5. Similar small volume abdominopelvic ascites. 6. Coronary artery atherosclerosis. Aortic Atherosclerosis (ICD10-I70.0). Case was preliminarily available and discussed at GI oncology conference (including pulmonary emboli), with Dr. Ammie Dalton, on the morning of 08/21/2017 at approximately 8:30 a.m. Electronically Signed   By: Abigail Miyamoto M.D.   On: 08/21/2017 09:13    Medications: I have reviewed the patient's current medications.  Assessment/Plan: 1. Multifocal hepatocellular carcinoma  Abdominal ultrasound 08/08/2017- multiple mass lesions of varying echogenicity within the liver with the largest within the right lobe appearing to extend into the intrahepatic IVC.  Mild ascites right upper quadrant.  08/08/2017 AFP elevated 225.9  MRI abdomen 08/11/2017- cirrhosis with multifocal hepatocellular carcinoma, dominant lesion right hepatic lobe 16.5 cm.  Dominant lesion invades the IVC with tumor thrombus in the IVC extending up toward and potentially into the lower portion of the right atrium.  Mild perihepatic ascites.  CTs 08/20/2017-multifocal hepatocellular carcinoma.  Right-sided tumor extension into the IVC and inferior right atrium.  Moderate to large volume bilateral pulmonary emboli.  Thoracic adenopathy suspicious for metastatic disease.   Nonspecific bilateral pulmonary nodules.  Similar small volume of abdominopelvic ascites. 2. Hepatitis C status post treatment with Epclusa 3. Exertional dyspnea probably secondary to COPD, question HCC involving the lungs, question pleural effusion 4. Abdominal pain secondary to #1 5. Bilateral pulmonary emboli on chest CT 08/20/2017.  Lovenox initiated 08/21/2017.     Disposition: Mr. Herda appears unchanged.  He had staging CT scans yesterday.  He was found to have bilateral pulmonary emboli.  Dr. Benay Spice recommends anticoagulation.  Mr. Mcelrath understands he is likely at increased risk for bleeding due to liver dysfunction and cirrhosis but also understands the need for anticoagulation due to the pulmonary emboli.  He agrees to anticoagulation.  Dr. Benay Spice recommends Lovenox 1 mg/kg every 12 hours.  He will receive the first dose with instruction in the office today.  He states he is comfortable administering the injections himself at home.  We sent a prescription to his pharmacy for Lovenox 80 mg subcu every 12 hours.  He understands to contact the office with any bleeding.  With regard to the hepatocellular carcinoma the plan is to begin sorafenib.  We anticipate a start date of 08/26/2017.  He will return for lab and follow-up on 09/04/2017.  He will contact the office in the interim as outlined above or with any other problems.  Patient seen with Dr. Benay Spice.  25 minutes were spent face-to-face at today's visit with the majority of that time involved in counseling/coordination of care.    Ned Card ANP/GNP-BC   08/21/2017  12:24 PM  This was a shared visit with Ned Card.  Mr. Martenson appears unchanged.  I presented his case at the GI tumor conference today.  The consensus recommendation is to begin treatment with systemic therapy.  He will begin sorafenib.  He met with the Cancer center pharmacist today. The CT scans confirm the lateral pulmonary emboli.  This may explain the  dyspnea.  He will begin anticoagulation therapy with Lovenox.  He will contact us for bleeding.  He will return for an office and lab visit approximately 10 days after beginning sorafenib.    Julieanne Manson, MD

## 2017-08-21 NOTE — Telephone Encounter (Addendum)
Pt voiced understanding, schedule message sent  ----- Message from Owens Shark, NP sent at 08/21/2017  8:46 AM EDT ----- Please call patient and have him come to office now to discuss CT scan.

## 2017-08-22 ENCOUNTER — Other Ambulatory Visit: Payer: Medicare Other

## 2017-08-22 ENCOUNTER — Inpatient Hospital Stay: Payer: Medicare Other | Admitting: Oncology

## 2017-08-22 ENCOUNTER — Encounter: Payer: Self-pay | Admitting: General Practice

## 2017-08-22 ENCOUNTER — Inpatient Hospital Stay: Payer: Medicare Other

## 2017-08-22 ENCOUNTER — Telehealth: Payer: Self-pay | Admitting: Pharmacy Technician

## 2017-08-22 ENCOUNTER — Telehealth: Payer: Self-pay | Admitting: *Deleted

## 2017-08-22 NOTE — Telephone Encounter (Signed)
Oral Oncology Patient Advocate Encounter  Met patient in [facility] lobby to complete application for REACH in an effort to reduce patient's out of pocket expense for Nexavar to $0.    Application completed and faxed to [phone].   REACH patient assistance phone number for follow up is [phone].   This encounter will be updated until final determination.   Fabio Asa. [name], [place] Patient [facility] [phone] 08/22/2017 9:41 AM

## 2017-08-22 NOTE — Progress Notes (Signed)
[facility] Spiritual Care Note  Referred by Lauren Somers/LCSW for emotional support as a conversation to process questions of ultimate meaning. Per pt, he has confidence in his medical and support team at [facility], support from local family, and friends with whom he can speak frankly. He reports being in a "good emotional place" at the moment, acknowledging that needs may change as tx progresses.  Jat[place]ion verbalized appreciation for Support Team and is aware of ongoing availability for emotional, spiritual, and programmatic support as desired. As always, please page if immediate needs arise or circumstances change. Thank you.   [place], [place], [facility] Pager [phone] Voicemail [phone]

## 2017-08-26 ENCOUNTER — Telehealth: Payer: Self-pay

## 2017-08-26 NOTE — Telephone Encounter (Signed)
Call from pt regarding a fall. Pt states that he fell Thursday evening with no noticeable injury. "the next day I had soreness in my R buttock. Saturday there was pain in the R buttock with noticeable swelling and it was tender to touch". Pt noted going to PCP and xray "showed no fracture or hematoma". Today pt notes "pain in buttock, not able to put pressure of lift leg much and a bruise on my R buttock". Per Dr. Benay Spice, condition should improve with time. Pt to continue lovenox and monitor condition, and ice sore area several times a day for no longer than 28minute intervals. If symptoms worsen, pt to alert our office to be seen for appt. Pt voiced understanding.

## 2017-08-29 ENCOUNTER — Telehealth: Payer: Self-pay | Admitting: Oncology

## 2017-08-29 ENCOUNTER — Telehealth: Payer: Self-pay

## 2017-08-29 NOTE — Telephone Encounter (Signed)
Called to report that status of condition post-fall is the same regarding pain, swelling and bruising on buttocks. "The bruised appearance has extended from top of buttocks to the top of my knee. It is not as widespread or intensely colored". Per MD pt to continue to monitor and if pt notes extensive swelling of area, call our office back. Pt voiced understanding of message

## 2017-08-29 NOTE — Telephone Encounter (Signed)
Oral Oncology Patient Advocate Encounter  Received notification from [facility] Patient Assistance program that patient has been successfully enrolled into their program to receive Nexavar from the manufacturer at $0 out of pocket until 05/06/2018.   I called and spoke with patient.  Shipment of the medication has already been arranged and will be delivered to his home on 08/30/2017.  He intends on beginning therapy on 08/31/2017.   Patient knows to call the office with questions or concerns.  Oral Oncology Clinic will continue to follow.  Gilmore Laroche, CPhT, [facility] Oral Oncology Patient Advocate [phone] 08/29/2017 11:40 AM

## 2017-08-29 NOTE — Telephone Encounter (Signed)
Patient called to cancel °

## 2017-09-01 ENCOUNTER — Emergency Department (HOSPITAL_COMMUNITY)
Admission: EM | Admit: 2017-09-01 | Discharge: 2017-09-01 | Disposition: A | Discharge status: Home / Self Care | Payer: Medicare Other | Attending: Emergency Medicine | Admitting: Emergency Medicine

## 2017-09-01 ENCOUNTER — Emergency Department (HOSPITAL_COMMUNITY): Payer: Medicare Other

## 2017-09-01 ENCOUNTER — Encounter (HOSPITAL_COMMUNITY): Payer: Self-pay | Admitting: Emergency Medicine

## 2017-09-01 DIAGNOSIS — Y999 Unspecified external cause status: Secondary | ICD-10-CM | POA: Diagnosis not present

## 2017-09-01 DIAGNOSIS — S79911A Unspecified injury of right hip, initial encounter: Secondary | ICD-10-CM | POA: Diagnosis present

## 2017-09-01 DIAGNOSIS — Y939 Activity, unspecified: Secondary | ICD-10-CM | POA: Diagnosis not present

## 2017-09-01 DIAGNOSIS — S7001XA Contusion of right hip, initial encounter: Secondary | ICD-10-CM | POA: Insufficient documentation

## 2017-09-01 DIAGNOSIS — Z7901 Long term (current) use of anticoagulants: Secondary | ICD-10-CM | POA: Diagnosis not present

## 2017-09-01 DIAGNOSIS — Y929 Unspecified place or not applicable: Secondary | ICD-10-CM | POA: Insufficient documentation

## 2017-09-01 DIAGNOSIS — Z87891 Personal history of nicotine dependence: Secondary | ICD-10-CM | POA: Diagnosis not present

## 2017-09-01 DIAGNOSIS — W19XXXA Unspecified fall, initial encounter: Secondary | ICD-10-CM | POA: Insufficient documentation

## 2017-09-01 DIAGNOSIS — Z79899 Other long term (current) drug therapy: Secondary | ICD-10-CM | POA: Diagnosis not present

## 2017-09-01 LAB — COMPREHENSIVE METABOLIC PANEL
ALT: 43 U/L (ref 17–63)
ANION GAP: 11 (ref 5–15)
AST: 83 U/L — ABNORMAL HIGH (ref 15–41)
Albumin: 3.4 g/dL — ABNORMAL LOW (ref 3.5–5.0)
Alkaline Phosphatase: 107 U/L (ref 38–126)
BUN: 21 mg/dL — ABNORMAL HIGH (ref 6–20)
CALCIUM: 9 mg/dL (ref 8.9–10.3)
CHLORIDE: 101 mmol/L (ref 101–111)
CO2: 25 mmol/L (ref 22–32)
Creatinine, Ser: 0.63 mg/dL (ref 0.61–1.24)
GFR calc non Af Amer: >60 mL/min (ref >60)
Glucose, Bld: 112 mg/dL — ABNORMAL HIGH (ref 65–99)
Potassium: 3.9 mmol/L (ref 3.5–5.1)
SODIUM: 137 mmol/L (ref 135–145)
Total Bilirubin: 2 mg/dL — ABNORMAL HIGH (ref 0.3–1.2)
Total Protein: 7.4 g/dL (ref 6.5–8.1)

## 2017-09-01 LAB — CBC WITH DIFFERENTIAL/PLATELET
Basophils Absolute: 0 10*3/uL (ref 0.0–0.1)
Basophils Relative: 1 %
EOS ABS: 0 10*3/uL (ref 0.0–0.7)
EOS PCT: 0 %
HCT: 41.9 % (ref 39.0–52.0)
Hemoglobin: 14 g/dL (ref 13.0–17.0)
LYMPHS ABS: 0.8 10*3/uL (ref 0.7–4.0)
Lymphocytes Relative: 13 %
MCH: 27.1 pg (ref 26.0–34.0)
MCHC: 33.4 g/dL (ref 30.0–36.0)
MCV: 81 fL (ref 78.0–100.0)
MONOS PCT: 12 %
Monocytes Absolute: 0.7 10*3/uL (ref 0.1–1.0)
Neutro Abs: 4.6 10*3/uL (ref 1.7–7.7)
Neutrophils Relative %: 74 %
PLATELETS: 258 10*3/uL (ref 150–400)
RBC: 5.17 MIL/uL (ref 4.22–5.81)
RDW: 17.7 % — AB (ref 11.5–15.5)
WBC: 6.2 10*3/uL (ref 4.0–10.5)

## 2017-09-01 MED ORDER — HYDROCODONE-ACETAMINOPHEN 5-325 MG PO TABS: 1.0000 | ORAL_TABLET | Freq: Four times a day (QID) | ORAL | 0 refills | Status: DC | PRN

## 2017-09-01 MED ORDER — ONDANSETRON HCL 4 MG/2ML IJ SOLN
4.0000 mg | Freq: Once | INTRAMUSCULAR | Status: AC
  Administered 2017-09-01: 4 mg via INTRAVENOUS
  Filled 2017-09-01: qty 2

## 2017-09-01 MED ORDER — SODIUM CHLORIDE 0.9 % IV BOLUS
1000.0000 mL | Freq: Once | INTRAVENOUS | Status: AC
  Administered 2017-09-01: 1000 mL via INTRAVENOUS

## 2017-09-01 MED ORDER — OXYCODONE-ACETAMINOPHEN 5-325 MG PO TABS
1.0000 | ORAL_TABLET | Freq: Once | ORAL | Status: AC
  Administered 2017-09-01: 1 via ORAL
  Filled 2017-09-01: qty 1

## 2017-09-01 MED ORDER — ONDANSETRON 4 MG PO TBDP: ORAL_TABLET | ORAL | 0 refills | Status: DC

## 2017-09-01 NOTE — ED Provider Notes (Signed)
[facility] DEPT Provider Note   CSN: [id number] Arrival date & time: 09/01/17  1310     History   Chief Complaint Chief Complaint  Patient presents with  . Fall  . Leg Swelling    HPI Wayne Rose is a 67 y.o. male.  Patient states that he fell a few weeks ago on his right hip.  He is taking Lovenox to treat PE.  Patient has had plain films of the hip which were negative.     Patient now has developed a large hematoma to his right hip  The history is provided by the patient. No language interpreter was used.  Fall  This is a new problem. The current episode started more than 2 days ago. The problem occurs rarely. The problem has been resolved. Pertinent negatives include no chest pain, no abdominal pain and no headaches. Exacerbated by: Palpitation. Nothing relieves the symptoms. He has tried nothing for the symptoms. The treatment provided no relief.    Past Medical History:  Diagnosis Date  . Hepatitis C    Treatment 2017- Cleared per patient    There are no active problems to display for this patient.   Past Surgical History:  Procedure Laterality Date  . SHOULDER ARTHROSCOPY WITH SUBACROMIAL DECOMPRESSION Left 2014        Home Medications    Prior to Admission medications   Medication Sig Start Date End Date Taking? Authorizing Provider  enoxaparin (LOVENOX) 80 MG/0.8ML injection Inject 0.8 mLs (80 mg total) into the skin every 12 (twelve) hours. 08/21/17  Yes Owens Shark, NP  SORAfenib (NEXAVAR) 200 MG tablet Week 1: Take 1 tablet (200mg ) by mouth 2 times daily. If tolerated, week 2: Take 2 tabs (400mg ) in AM & 1 tab (200mg ) in PM. If tolerated, week 3: take 2 tabs (400mg ) 2 times daily. Give on an empty stomach 1 hour before or 2 hours after meals. 08/21/17  Yes Ladell Pier, MD  HYDROcodone-acetaminophen (NORCO/VICODIN) 5-325 MG tablet Take 1 tablet by mouth every 6 (six) hours as needed for moderate pain. 09/01/17    Milton Ferguson, MD  ondansetron (ZOFRAN ODT) 4 MG disintegrating tablet 4mg  ODT q4 hours prn nausea/vomit 09/01/17   Milton Ferguson, MD  PROAIR HFA 108 [phone] Base) MCG/ACT inhaler Inhale 2 puffs into the lungs every 6 (six) hours as needed for wheezing or shortness of breath.  08/06/17   [provider]    Family History Family History  Problem Relation Age of Onset  . Lung cancer Sister     Social History Social History   Tobacco Use  . Smoking status: Former Smoker    Years: 50.00    Types: Cigarettes    Last attempt to quit: 2018    Years since quitting: 1.3  . Smokeless tobacco: Never Used  . Tobacco comment: Uses Nicotine Gum currently  Substance Use Topics  . Alcohol use: Never    Frequency: Never  . Drug use: Never     Allergies   Patient has no known allergies.   Review of Systems Review of Systems  Constitutional: Negative for appetite change and fatigue.  HENT: Negative for congestion, ear discharge and sinus pressure.   Eyes: Negative for discharge.  Respiratory: Negative for cough.   Cardiovascular: Negative for chest pain.  Gastrointestinal: Negative for abdominal pain and diarrhea.  Genitourinary: Negative for frequency and hematuria.  Musculoskeletal: Negative for back pain.       Right hip pain  Skin: Negative for rash.  Neurological: Negative for seizures and headaches.  Psychiatric/Behavioral: Negative for hallucinations.     Physical Exam Updated Vital Signs BP (!) 150/103 (BP Location: Left Arm)   Pulse 70   Temp 98.2 F (36.8 C) (Oral)   Resp 18   Ht 5' 10.5" (1.791 m)   SpO2 92%   BMI 26.69 kg/m   Physical Exam  Constitutional: He is oriented to person, place, and time. He appears well-developed.  HENT:  Head: Normocephalic.  Eyes: Conjunctivae and EOM are normal. No scleral icterus.  Neck: Neck supple. No thyromegaly present.  Cardiovascular: Normal rate and regular rhythm. Exam reveals no gallop and no friction rub.  No  murmur heard. Pulmonary/Chest: No stridor. He has no wheezes. He has no rales. He exhibits no tenderness.  Abdominal: He exhibits no distension. There is no tenderness. There is no rebound.  Musculoskeletal: He exhibits no edema.  Large contusion to right hip  Lymphadenopathy:    He has no cervical adenopathy.  Neurological: He is oriented to person, place, and time. He exhibits normal muscle tone. Coordination normal.  Skin: No rash noted. No erythema.  Psychiatric: He has a normal mood and affect. His behavior is normal.     ED Treatments / Results  Labs (all labs ordered are listed, but only abnormal results are displayed) Labs Reviewed  CBC WITH DIFFERENTIAL/PLATELET - Abnormal; Notable for the following components:      Result Value   RDW 17.7 (*)    All other components within normal limits  COMPREHENSIVE METABOLIC PANEL - Abnormal; Notable for the following components:   Glucose, Bld 112 (*)    BUN 21 (*)    Albumin 3.4 (*)    AST 83 (*)    Total Bilirubin 2.0 (*)    All other components within normal limits    EKG None  Radiology Ct Hip Right Wo Contrast  Result Date: 09/01/2017 CLINICAL DATA:  [place] 08/23/2017.  Persistent right hip pain. EXAM: CT OF THE RIGHT HIP WITHOUT CONTRAST TECHNIQUE: Multidetector CT imaging of the right hip was performed according to the standard protocol. Multiplanar CT image reconstructions were also generated. COMPARISON:  CT scan 08/20/2017 FINDINGS: The right hip is normally located. Moderate to advanced right hip joint degenerative changes with joint space narrowing, osteophytic spurring and subchondral cystic change. No acute fracture or evidence of AVN. The visualized right hemipelvis is intact. The right SI joint appears normal. No right-sided pubic rami fractures are identified. There is a large rounded area of increased attenuation in the lateral aspect of the gluteus maximus muscle which is most likely a hematoma related to the  patient's fall. There is also associated inflammation/edema and fluid in the overlying subcutaneous fat. The hematoma measures approximately 6.9 x 6.8 x 6.5 cm. It likely accounts for the patient's right hip pain. IMPRESSION: 1. 6.9 x 6.8 x 6.5 cm intramuscular hematoma in the lateral aspect of the gluteus maximus muscle likely accounting for the patient's hip pain. 2. No hip or right-sided pelvic fractures. 3. No significant intra pelvic abnormalities. 4. 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 Electronically Signed   By: Marijo Sanes M.D.   On: 09/01/2017 16:20    Procedures Procedures (including critical care time)  Medications Ordered in ED Medications  sodium chloride 0.9 % bolus 1,000 mL (1,000 mLs Intravenous New Bag/Given 09/01/17 1658)  ondansetron (ZOFRAN) injection 4 mg (4 mg Intravenous Given 09/01/17 1658)  oxyCODONE-acetaminophen (PERCOCET/ROXICET) 5-325 MG per  tablet 1 tablet (1 tablet Oral Given 09/01/17 1658)     Initial Impression / Assessment and Plan / ED Course  I have reviewed the triage vital signs and the nursing notes.  Pertinent labs & imaging results that were available during my care of the patient were reviewed by me and considered in my medical decision making (see chart for details).   CT scan shows hematoma to right hip..  Patient with contusion to right hip with large hematoma.  He will use a walker to ambulate with patient is given hydrocodone and Zofran and will follow up with his oncologist and an orthopedic doctor   Final Clinical Impressions(s) / ED Diagnoses   Final diagnoses:  Fall, initial encounter    ED Discharge Orders        Ordered    HYDROcodone-acetaminophen (NORCO/VICODIN) 5-325 MG tablet  Every 6 hours PRN     09/01/17 1805    ondansetron (ZOFRAN ODT) 4 MG disintegrating tablet     09/01/17 1805       Milton Ferguson, MD 09/01/17 1811

## 2017-09-01 NOTE — Discharge Instructions (Addendum)
Use a walker to ambulate with.  Drink plenty of fluids.  Follow-up Tuesday with your oncologist as planned.  He can recheck your hip at that time.  He also can follow-up with the orthopedic doctor named Dr. Lorin Mercy in the next week

## 2017-09-01 NOTE — ED Triage Notes (Signed)
10 days ago fell was seen at PCP xray was good.  Has big bruise on right buttock and continued having issues with swelling and bruising now down to his foot. Reports that he started blood thinners on April 17th and cancer medications yesterday.

## 2017-09-02 ENCOUNTER — Inpatient Hospital Stay: Payer: Medicare Other | Admitting: Nutrition

## 2017-09-04 ENCOUNTER — Inpatient Hospital Stay: Payer: Medicare Other

## 2017-09-04 ENCOUNTER — Inpatient Hospital Stay: Payer: Medicare Other | Attending: Nurse Practitioner | Admitting: Oncology

## 2017-09-04 ENCOUNTER — Telehealth: Payer: Self-pay | Admitting: Oncology

## 2017-09-04 VITALS — BP 129/99 | HR 77 | Temp 97.8°F | Resp 18 | Ht 70.5 in | Wt 171.6 lb

## 2017-09-04 DIAGNOSIS — M79604 Pain in right leg: Secondary | ICD-10-CM | POA: Diagnosis not present

## 2017-09-04 DIAGNOSIS — R110 Nausea: Secondary | ICD-10-CM | POA: Insufficient documentation

## 2017-09-04 DIAGNOSIS — C220 Liver cell carcinoma: Secondary | ICD-10-CM | POA: Diagnosis not present

## 2017-09-04 DIAGNOSIS — R51 Headache: Secondary | ICD-10-CM | POA: Insufficient documentation

## 2017-09-04 DIAGNOSIS — K069 Disorder of gingiva and edentulous alveolar ridge, unspecified: Secondary | ICD-10-CM | POA: Insufficient documentation

## 2017-09-04 DIAGNOSIS — R918 Other nonspecific abnormal finding of lung field: Secondary | ICD-10-CM | POA: Diagnosis not present

## 2017-09-04 DIAGNOSIS — Y92009 Unspecified place in unspecified non-institutional (private) residence as the place of occurrence of the external cause: Secondary | ICD-10-CM | POA: Diagnosis not present

## 2017-09-04 DIAGNOSIS — J449 Chronic obstructive pulmonary disease, unspecified: Secondary | ICD-10-CM | POA: Diagnosis not present

## 2017-09-04 DIAGNOSIS — R188 Other ascites: Secondary | ICD-10-CM | POA: Diagnosis not present

## 2017-09-04 DIAGNOSIS — W19XXXA Unspecified fall, initial encounter: Secondary | ICD-10-CM

## 2017-09-04 DIAGNOSIS — K7469 Other cirrhosis of liver: Secondary | ICD-10-CM | POA: Diagnosis not present

## 2017-09-04 DIAGNOSIS — S8011XA Contusion of right lower leg, initial encounter: Secondary | ICD-10-CM | POA: Diagnosis not present

## 2017-09-04 DIAGNOSIS — Z7901 Long term (current) use of anticoagulants: Secondary | ICD-10-CM

## 2017-09-04 DIAGNOSIS — I2609 Other pulmonary embolism with acute cor pulmonale: Secondary | ICD-10-CM

## 2017-09-04 DIAGNOSIS — I2699 Other pulmonary embolism without acute cor pulmonale: Secondary | ICD-10-CM | POA: Diagnosis not present

## 2017-09-04 DIAGNOSIS — G893 Neoplasm related pain (acute) (chronic): Secondary | ICD-10-CM | POA: Insufficient documentation

## 2017-09-04 LAB — CBC WITH DIFFERENTIAL (CANCER CENTER ONLY)
Basophils Absolute: 0.1 10*3/uL (ref 0.0–0.1)
Basophils Relative: 1 %
EOS ABS: 0 10*3/uL (ref 0.0–0.5)
EOS PCT: 0 %
HCT: 46.1 % (ref 38.4–49.9)
Hemoglobin: 15.3 g/dL (ref 13.0–17.1)
LYMPHS ABS: 0.7 10*3/uL — AB (ref 0.9–3.3)
Lymphocytes Relative: 8 %
MCH: 26.7 pg — AB (ref 27.2–33.4)
MCHC: 33.1 g/dL (ref 32.0–36.0)
MCV: 80.6 fL (ref 79.3–98.0)
MONO ABS: 0.8 10*3/uL (ref 0.1–0.9)
MONOS PCT: 9 %
Neutro Abs: 7.6 10*3/uL — ABNORMAL HIGH (ref 1.5–6.5)
Neutrophils Relative %: 82 %
PLATELETS: 336 10*3/uL (ref 140–400)
RBC: 5.72 MIL/uL (ref 4.20–5.82)
RDW: 18.7 % — AB (ref 11.0–14.6)
WBC Count: 9.3 10*3/uL (ref 4.0–10.3)

## 2017-09-04 LAB — CMP (CANCER CENTER ONLY)
ALBUMIN: 3.4 g/dL — AB (ref 3.5–5.0)
ALT: 47 U/L (ref 0–55)
AST: 107 U/L — AB (ref 5–34)
Alkaline Phosphatase: 134 U/L (ref 40–150)
Anion gap: 10 (ref 3–11)
BUN: 28 mg/dL — AB (ref 7–26)
CHLORIDE: 99 mmol/L (ref 98–109)
CO2: 28 mmol/L (ref 22–29)
CREATININE: 0.96 mg/dL (ref 0.70–1.30)
Calcium: 10.2 mg/dL (ref 8.4–10.4)
GFR, Est AFR Am: >60 mL/min (ref >60)
GFR, Estimated: >60 mL/min (ref >60)
GLUCOSE: 153 mg/dL — AB (ref 70–140)
POTASSIUM: 5.1 mmol/L (ref 3.5–5.1)
Sodium: 137 mmol/L (ref 136–145)
Total Bilirubin: 2.1 mg/dL — ABNORMAL HIGH (ref 0.2–1.2)
Total Protein: 8.1 g/dL (ref 6.4–8.3)

## 2017-09-04 LAB — MAGNESIUM: MAGNESIUM: 2.2 mg/dL (ref 1.7–2.4)

## 2017-09-04 LAB — AMYLASE: Amylase: 156 U/L — ABNORMAL HIGH (ref 28–100)

## 2017-09-04 LAB — PHOSPHORUS: Phosphorus: 3 mg/dL (ref 2.5–4.6)

## 2017-09-04 MED ORDER — OXYCODONE-ACETAMINOPHEN 5-325 MG PO TABS: 1.0000 | ORAL_TABLET | ORAL | 0 refills | Status: DC | PRN

## 2017-09-04 NOTE — Progress Notes (Signed)
Wayne Rose   Diagnosis: Hepatocellular carcinoma  INTERVAL HISTORY:   Wayne Rose returns for scheduled visit.  He fell in his home on approximately 08/22/2017.  He developed swelling and pain at the right "hip ".  He was seen at an urgent care and reports an x-ray showed no fracture.  Over the next week he developed progressive swelling and discoloration of the right upper and lower leg.  He was seen in the emergency room on 09/01/2017.  A CT of the right hip revealed no fracture.  An intramuscular hematoma was noted in the lateral aspect of the gluteus maximus.  He continues to have pain and swelling near the right trochanter.  He has difficulty sitting.  He is able to ambulate without difficulty.  He was prescribed Hedrick codon.  He reports the hydrocodone partially relieves the pain. The discoloration of the right leg has improved this week. He began sorafenib on 08/31/2017.  No mouth sores or diarrhea.  He continues to have a cough and dyspnea.  He remains on Lovenox anticoagulation.  Objective:  Vital signs in last 24 hours:  Blood pressure (!) 129/99, pulse 77, temperature 97.8 F (36.6 C), temperature source Oral, resp. rate 18, height 5' 10.5" (1.791 m), weight 171 lb 9.6 oz (77.8 kg), SpO2 94 %.    HEENT: No thrush or ulcers Resp: Inspiratory rhonchi at the right posterior base, no respiratory distress Cardio: Regular rate and rhythm GI: No hepatomegaly, nontender Vascular: Trace edema throughout the right leg. Skin: Ecchymosis at the right lateral thigh and right lower leg/foot.  Hematoma over the right trochanter.  Few 1-2 mm erythematous raised lesions over the trunk  Portacath/PICC-without erythema  Lab Results:  Lab Results  Component Value Date   WBC 9.3 09/04/2017   HGB 15.3 09/04/2017   HCT 46.1 09/04/2017   MCV 80.6 09/04/2017   PLT 336 09/04/2017   NEUTROABS 7.6 (H) 09/04/2017    CMP     Component Value Date/Time   NA 137 09/04/2017 0939   K 5.1 09/04/2017 0939   CL 99 09/04/2017 0939   CO2 28 09/04/2017 0939   GLUCOSE 153 (H) 09/04/2017 0939   BUN 28 (H) 09/04/2017 0939   CREATININE 0.96 09/04/2017 0939   CALCIUM 10.2 09/04/2017 0939   PROT 8.1 09/04/2017 0939   ALBUMIN 3.4 (L) 09/04/2017 0939   AST 107 (H) 09/04/2017 0939   ALT 47 09/04/2017 0939   ALKPHOS 134 09/04/2017 0939   BILITOT 2.1 (H) 09/04/2017 0939   GFRNONAA >60 09/04/2017 0939   GFRAA >60 09/04/2017 0939    No results found for: CEA1  No results found for: INR  Imaging:  Ct Hip Right Wo Contrast  Result Date: 09/01/2017 CLINICAL DATA:  [place] 08/23/2017.  Persistent right hip pain. EXAM: CT OF THE RIGHT HIP WITHOUT CONTRAST TECHNIQUE: Multidetector CT imaging of the right hip was performed according to the standard protocol. Multiplanar CT image reconstructions were also generated. COMPARISON:  CT scan 08/20/2017 FINDINGS: The right hip is normally located. Moderate to advanced right hip joint degenerative changes with joint space narrowing, osteophytic spurring and subchondral cystic change. No acute fracture or evidence of AVN. The visualized right hemipelvis is intact. The right SI joint appears normal. No right-sided pubic rami fractures are identified. There is a large rounded area of increased attenuation in the lateral aspect of the gluteus maximus muscle which is most likely a hematoma related to the patient's fall. There is also  associated inflammation/edema and fluid in the overlying subcutaneous fat. The hematoma measures approximately 6.9 x 6.8 x 6.5 cm. It likely accounts for the patient's right hip pain. IMPRESSION: 1. 6.9 x 6.8 x 6.5 cm intramuscular hematoma in the lateral aspect of the gluteus maximus muscle likely accounting for the patient's hip pain. 2. No hip or right-sided pelvic fractures. 3. No significant intra pelvic abnormalities. 4. 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 Electronically  Signed   By: Marijo Sanes M.D.   On: 09/01/2017 16:20    Medications: I have reviewed the patient's current medications.   Assessment/Plan: 1. Multifocal hepatocellular carcinoma  Abdominal ultrasound 08/08/2017- multiple mass lesions of varying echogenicity within the liver with the largest within the right lobe appearing to extend into the intrahepatic IVC. Mild ascites right upper quadrant.  08/08/2017 AFP elevated225.9  MRI abdomen 08/11/2017- cirrhosis with multifocal hepatocellular carcinoma, dominant lesion right hepatic lobe 16.5 cm. Dominant lesion invades the IVC with tumor thrombus in the IVC extending up toward and potentially into the lower portion of the right atrium. Mild perihepatic ascites.  CTs 08/20/2017-multifocal hepatocellular carcinoma.  Right-sided tumor extension into the IVC and inferior right atrium.  Moderate to large volume bilateral pulmonary emboli.  Thoracic adenopathy suspicious for metastatic disease.  Nonspecific bilateral pulmonary nodules.  Similar small volume of abdominopelvic ascites.  Sorafenib started 08/31/2017 2. Hepatitis C status post treatment with Epclusa 3. Exertional dyspnea probably secondary to COPD, question HCC involving the lungs, question pleural effusion 4. Abdominal pain secondary to #1 5. Bilateral pulmonary emboli on chest CT 08/20/2017.  Lovenox initiated 08/21/2017. 6.   Fall 08/22/2017-hematoma right gluteus  Disposition: Wayne Rose has advanced stage hepatocellular carcinoma.  He began a trial of sorafenib on 08/31/2017.  He is tolerating sorafenib well to date. He is recovering from a fall with a hematoma of the right leg.  The right leg appears to be slowly improving.  He has been maintained on twice daily Lovenox after being diagnosed with pulmonary emboli on 08/20/2017.  He will decrease Lovenox to once daily while the hematoma resolves.  He will return for an office visit next week with the plan to switch to full dose once daily  Lovenox if the hematoma has improved further.  He was given a prescription for Percocet to use as needed for pain.  25 minutes were spent with the patient today.  The majority of the time was used for counseling and coordination of care.  Betsy Coder, MD  09/04/2017  10:51 AM

## 2017-09-04 NOTE — Telephone Encounter (Signed)
Scheduled appt per 5/1 los - Gave pt AVS and calender per los.

## 2017-09-06 ENCOUNTER — Encounter: Payer: Self-pay | Admitting: Nurse Practitioner

## 2017-09-06 NOTE — Progress Notes (Signed)
Patient called to inquire about financial assistance for hospital bills. Asked patient if he has met his ded/OOP yet. Patient states he has not, but has not received a bill or EOB from insurance yet. Advised patient once everything has been processed through insurance and insurance has paid, he will then receive a bill. He may contact the number on the bill to make arrangements or explore other opportunities that he may be eligible for such as [employer] settlement or [employer]. Also provided him the number to billing if he had any additional questions before he receives his bill. Patient verbalized understanding.  Patient is on oral chemo and does not qualify for in-house grant. Called patient back to also advise him there are currently no foundations that have copay assistance for his diagnosis and if something comes open, I will let him know. Patient states he is getting his medication for free, he was just concerned about the copays for the scans and other visits. Advised him again, once he gets his statement, he may contact the billing number to discuss what may be available to him. He appreciated the call.

## 2017-09-09 ENCOUNTER — Telehealth: Payer: Self-pay

## 2017-09-09 DIAGNOSIS — C220 Liver cell carcinoma: Secondary | ICD-10-CM

## 2017-09-09 MED ORDER — ONDANSETRON 4 MG PO TBDP: ORAL_TABLET | ORAL | 0 refills | Status: DC

## 2017-09-09 NOTE — Telephone Encounter (Signed)
Spoke with pt regarding symptoms. Pt states "Ive been experiencing nausea", and needs nausea medication refilled. Pt also states that he is experiencing 'pain in my liver area and its a new pain". This RN will consult with MD. Will refill nausea med and pt to take pain med to manage pain, per MD. Pt to call with any questions/concerns.

## 2017-09-12 ENCOUNTER — Inpatient Hospital Stay: Payer: Medicare Other

## 2017-09-12 ENCOUNTER — Encounter: Payer: Self-pay | Admitting: Nurse Practitioner

## 2017-09-12 ENCOUNTER — Inpatient Hospital Stay (HOSPITAL_BASED_OUTPATIENT_CLINIC_OR_DEPARTMENT_OTHER): Payer: Medicare Other | Admitting: Nurse Practitioner

## 2017-09-12 VITALS — BP 121/92 | HR 92 | Temp 98.0°F | Resp 18 | Ht 70.5 in | Wt 173.7 lb

## 2017-09-12 DIAGNOSIS — R918 Other nonspecific abnormal finding of lung field: Secondary | ICD-10-CM

## 2017-09-12 DIAGNOSIS — Z7901 Long term (current) use of anticoagulants: Secondary | ICD-10-CM

## 2017-09-12 DIAGNOSIS — W19XXXA Unspecified fall, initial encounter: Secondary | ICD-10-CM

## 2017-09-12 DIAGNOSIS — R188 Other ascites: Secondary | ICD-10-CM | POA: Diagnosis not present

## 2017-09-12 DIAGNOSIS — Y92009 Unspecified place in unspecified non-institutional (private) residence as the place of occurrence of the external cause: Secondary | ICD-10-CM | POA: Diagnosis not present

## 2017-09-12 DIAGNOSIS — I2699 Other pulmonary embolism without acute cor pulmonale: Secondary | ICD-10-CM | POA: Diagnosis not present

## 2017-09-12 DIAGNOSIS — C220 Liver cell carcinoma: Secondary | ICD-10-CM

## 2017-09-12 DIAGNOSIS — S8011XA Contusion of right lower leg, initial encounter: Secondary | ICD-10-CM

## 2017-09-12 DIAGNOSIS — R110 Nausea: Secondary | ICD-10-CM

## 2017-09-12 DIAGNOSIS — J449 Chronic obstructive pulmonary disease, unspecified: Secondary | ICD-10-CM

## 2017-09-12 DIAGNOSIS — R51 Headache: Secondary | ICD-10-CM

## 2017-09-12 LAB — CMP (CANCER CENTER ONLY)
ALK PHOS: 134 U/L (ref 40–150)
ALT: 65 U/L — ABNORMAL HIGH (ref 0–55)
ANION GAP: 9 (ref 3–11)
AST: 188 U/L (ref 5–34)
Albumin: 2.8 g/dL — ABNORMAL LOW (ref 3.5–5.0)
BILIRUBIN TOTAL: 2.2 mg/dL — AB (ref 0.2–1.2)
BUN: 30 mg/dL — ABNORMAL HIGH (ref 7–26)
CALCIUM: 9.3 mg/dL (ref 8.4–10.4)
CO2: 24 mmol/L (ref 22–29)
Chloride: 102 mmol/L (ref 98–109)
Creatinine: 0.76 mg/dL (ref 0.70–1.30)
Glucose, Bld: 94 mg/dL (ref 70–140)
POTASSIUM: 4.1 mmol/L (ref 3.5–5.1)
Sodium: 135 mmol/L — ABNORMAL LOW (ref 136–145)
TOTAL PROTEIN: 7.7 g/dL (ref 6.4–8.3)

## 2017-09-12 LAB — CBC WITH DIFFERENTIAL (CANCER CENTER ONLY)
BASOS ABS: 0 10*3/uL (ref 0.0–0.1)
BASOS PCT: 0 %
EOS PCT: 0 %
Eosinophils Absolute: 0 10*3/uL (ref 0.0–0.5)
HEMATOCRIT: 44 % (ref 38.4–49.9)
Hemoglobin: 14.5 g/dL (ref 13.0–17.1)
Lymphocytes Relative: 5 %
Lymphs Abs: 0.5 10*3/uL — ABNORMAL LOW (ref 0.9–3.3)
MCH: 26.8 pg — ABNORMAL LOW (ref 27.2–33.4)
MCHC: 33 g/dL (ref 32.0–36.0)
MCV: 81.3 fL (ref 79.3–98.0)
MONOS PCT: 10 %
Monocytes Absolute: 1.1 10*3/uL — ABNORMAL HIGH (ref 0.1–0.9)
NEUTROS PCT: 85 %
Neutro Abs: 9.2 10*3/uL — ABNORMAL HIGH (ref 1.5–6.5)
PLATELETS: 252 10*3/uL (ref 140–400)
RBC: 5.41 MIL/uL (ref 4.20–5.82)
RDW: 18.5 % — ABNORMAL HIGH (ref 11.0–14.6)
WBC: 10.9 10*3/uL — AB (ref 4.0–10.3)

## 2017-09-12 NOTE — Progress Notes (Addendum)
[facility] OFFICE PROGRESS NOTE   Diagnosis: Hepatocellular carcinoma  INTERVAL HISTORY:   Wayne Rose returns as scheduled.  He continues sorafenib.  This week he has had intermittent nausea and headaches.  The headaches improve with Percocet.  He takes Zofran as needed.  No visual disturbance.  He feels weak overall.  No falls.  No balance issues.  He continues to have significant right hip pain.  He takes Percocet as needed.  Persistent swelling of the right leg.  He had an episode of loose stools after taking a laxative.  No rash.  He continues to have dyspnea.  Objective:  Vital signs in last 24 hours:  Blood pressure (!) 121/92, pulse 92, temperature 98 F (36.7 C), temperature source Oral, resp. rate 18, height 5' 10.5" (1.791 m), weight 173 lb 11.2 oz (78.8 kg), SpO2 96 %.    HEENT: No thrush or ulcers. Resp: Lungs clear bilaterally. Cardio: Regular rate and rhythm. GI: No hepatomegaly. Vascular: Trace edema throughout the right leg. Neuro: Alert and oriented.  Follows commands.  Motor strength 5/5. Skin: Ecchymosis right hip/buttock extending along the lateral aspect of the leg to the foot.  Ecchymosis appears to be resolving.   Lab Results:  Lab Results  Component Value Date   WBC 10.9 (H) 09/12/2017   HGB 14.5 09/12/2017   HCT 44.0 09/12/2017   MCV 81.3 09/12/2017   PLT 252 09/12/2017   NEUTROABS 9.2 (H) 09/12/2017    Imaging:  No results found.  Medications: I have reviewed the patient's current medications.  Assessment/Plan: 1. Multifocal hepatocellular carcinoma  Abdominal ultrasound 08/08/2017-multiple mass lesions of varying echogenicity within the liver with the largest within the right lobe appearing to extend into the intrahepatic IVC. Mild ascites right upper quadrant.  08/08/2017 AFP elevated225.9  MRI abdomen 08/11/2017-cirrhosis with multifocal hepatocellular carcinoma, dominant lesion right hepatic lobe 16.5 cm. Dominant  lesion invades the IVC with tumor thrombus in the IVC extending up toward and potentially into the lower portion of the right atrium. Mild perihepatic ascites.  CTs4/16/2019-multifocal hepatocellular carcinoma. Right-sided tumor extension into the IVC and inferior right atrium. Moderate to large volume bilateral pulmonary emboli. Thoracic adenopathy suspicious for metastatic disease. Nonspecific bilateral pulmonary nodules. Similar small volume of abdominopelvic ascites.  Sorafenib started 08/31/2017 2. Hepatitis C status post treatment with Epclusa 3. Exertional dyspnea probably secondary to COPD, question HCC involving the lungs, question pleural effusion 4. Abdominal pain secondary to #1 5. Bilateral pulmonary emboli on chest CT 08/20/2017. Lovenox initiated 08/21/2017. 6. Fall 08/22/2017-hematoma right gluteus    Disposition: Wayne Rose appears stable.  He will continue sorafenib.  The hematoma of the right leg appears to be resolving.  He will increase Lovenox back to twice daily dosing.  He will continue Percocet as needed.  He is having intermittent headaches and nausea.  He will contact the office if these worsen or he develops any new symptoms.  He will return for lab and follow-up in 2 weeks.  He will contact the office in the interim as outlined above or with any other problems.  Patient seen with Dr. Benay Rose.    Ned Card ANP/GNP-BC   09/12/2017  11:46 AM  This was a shared visit with Ned Card.  Wayne Rose was interviewed and examined.  The right leg hematoma and ecchymoses appear to be slowly resolving.  He will continue Lovenox anticoagulation.  He will resume twice daily dosing.  He appears to be tolerating the sorafenib well.  Wayne Rose  Wayne Spice, MD

## 2017-09-13 ENCOUNTER — Telehealth: Payer: Self-pay

## 2017-09-13 NOTE — Telephone Encounter (Addendum)
Spoke with pt. In regards to note below. Pt verbalized understanding.    ----- Message from Owens Shark, NP sent at 09/13/2017  9:22 AM EDT ----- Please tell him to continue the current dose of sorafenib (do not increase as outlined on previous instructions).

## 2017-09-17 ENCOUNTER — Other Ambulatory Visit: Payer: Self-pay | Admitting: Nurse Practitioner

## 2017-09-17 ENCOUNTER — Telehealth: Payer: Self-pay | Admitting: *Deleted

## 2017-09-17 DIAGNOSIS — C220 Liver cell carcinoma: Secondary | ICD-10-CM

## 2017-09-17 MED ORDER — OXYCODONE-ACETAMINOPHEN 5-325 MG PO TABS: 1.0000 | ORAL_TABLET | Freq: Four times a day (QID) | ORAL | 0 refills | Status: DC | PRN

## 2017-09-17 NOTE — Telephone Encounter (Signed)
Call from pt requesting refill of Oxycodone. He is taking it for constant RLE pain. He reports oxycodone also helps the abdominal pain. Pt rates pain 5/10 with ambulation and movement. He reports pain gets down to 3/4 with movement after taking pain medicine. Abdominal pain is intermittent, pt unable to rate since his main concern is leg pain.

## 2017-09-19 ENCOUNTER — Telehealth: Payer: Self-pay | Admitting: Medical Oncology

## 2017-09-19 NOTE — Telephone Encounter (Addendum)
Returned call to pt, he does not have dentist. Soreness began 1 week ago.  He describes it as "a large blister about the size of two teeth."  Instructed him to try salt water rinses. Will review with MD for further recommendations/ orders.

## 2017-09-19 NOTE — Telephone Encounter (Signed)
Discussed pt's call with Dr. Benay Spice: We can see him on 5/17 or he can try Orajel OTC for pain. Pt stated transportation is an issue. He will try salt water rinses for now. Instructed him to call if he can come in.

## 2017-09-19 NOTE — Telephone Encounter (Signed)
Persistent dental pain x 1 week moves around > started in R lower molar, bicuspid, in sizer and now jaw area at chin. Swelling in below the teeth on lower gum , biting triggers pain.  He took extra oxycontin ,  has not done any oral saline rinses. He is on sorafinib, lovenox . Does not have a dentist -requested Dr Enrique Sack.Please advise.

## 2017-09-20 ENCOUNTER — Telehealth: Payer: Self-pay | Admitting: *Deleted

## 2017-09-20 DIAGNOSIS — K1231 Oral mucositis (ulcerative) due to antineoplastic therapy: Secondary | ICD-10-CM

## 2017-09-20 MED ORDER — MAGIC MOUTHWASH: 10.0000 mL | Freq: Four times a day (QID) | ORAL | 0 refills | Status: DC | PRN

## 2017-09-20 NOTE — Telephone Encounter (Signed)
Message from pt requesting prescription for mucositis. Returned call, he reports he is using saltwater rinses. Noticed small amount of bleeding this morning. Pt declined office visit. MMW called in per Dr. Benay Spice.

## 2017-09-25 ENCOUNTER — Telehealth: Payer: Self-pay | Admitting: Pharmacist

## 2017-09-25 DIAGNOSIS — C220 Liver cell carcinoma: Secondary | ICD-10-CM

## 2017-09-25 MED ORDER — SORAFENIB TOSYLATE 200 MG PO TABS: ORAL_TABLET | ORAL | 0 refills | Status: DC

## 2017-09-25 NOTE — Telephone Encounter (Signed)
Oral Oncology Pharmacist Encounter  Received call from patient with request for directions on how to obtain next Nexavar refill from the [place] patient assistance foundation.  Patient instructed to call [facility] to request refill at [phone].  I will work with Dr. Benay Spice and collaborative practice RN to obtain new written prescription for Nexavar.  Patient states he is taking his Nexavar 200 mg tablets, 2 tablets (400 mg) by mouth in a.m. and 1 tablet (200 mg) by mouth in p.m. both taken on an empty stomach. Patient states they have not titrated up to 4 tablets daily.  Written prescription will be faxed to the [place] patient assistance program at [phone] with a cover sheet.   I called and updated patient that refill prescription for Nexavar would be faxed today or tomorrow. Patient states he has 10 days of pills remaining.  Oral Oncology Clinic will continue to follow.  Johny Drilling, PharmD, BCPS, BCOP  09/25/2017   11:51 AM Oral Oncology Clinic [phone]

## 2017-09-25 NOTE — Telephone Encounter (Signed)
Ok, thanks, we are seeing him tomorrow

## 2017-09-27 ENCOUNTER — Inpatient Hospital Stay (HOSPITAL_BASED_OUTPATIENT_CLINIC_OR_DEPARTMENT_OTHER): Payer: Medicare Other | Admitting: Oncology

## 2017-09-27 ENCOUNTER — Telehealth: Payer: Self-pay | Admitting: Oncology

## 2017-09-27 ENCOUNTER — Inpatient Hospital Stay: Payer: Medicare Other

## 2017-09-27 ENCOUNTER — Telehealth: Payer: Self-pay

## 2017-09-27 VITALS — BP 134/96 | HR 107 | Temp 98.6°F | Resp 22 | Ht 70.5 in | Wt 161.9 lb

## 2017-09-27 DIAGNOSIS — Z79899 Other long term (current) drug therapy: Secondary | ICD-10-CM | POA: Diagnosis not present

## 2017-09-27 DIAGNOSIS — K069 Disorder of gingiva and edentulous alveolar ridge, unspecified: Secondary | ICD-10-CM

## 2017-09-27 DIAGNOSIS — C220 Liver cell carcinoma: Secondary | ICD-10-CM

## 2017-09-27 DIAGNOSIS — I2699 Other pulmonary embolism without acute cor pulmonale: Secondary | ICD-10-CM | POA: Diagnosis not present

## 2017-09-27 DIAGNOSIS — Z7901 Long term (current) use of anticoagulants: Secondary | ICD-10-CM

## 2017-09-27 LAB — CBC WITH DIFFERENTIAL (CANCER CENTER ONLY)
Basophils Absolute: 0.1 10*3/uL (ref 0.0–0.1)
Basophils Relative: 1 %
Eosinophils Absolute: 0.1 10*3/uL (ref 0.0–0.5)
Eosinophils Relative: 2 %
HEMATOCRIT: 46.2 % (ref 38.4–49.9)
Hemoglobin: 15.1 g/dL (ref 13.0–17.1)
LYMPHS ABS: 0.7 10*3/uL — AB (ref 0.9–3.3)
LYMPHS PCT: 11 %
MCH: 26.6 pg — ABNORMAL LOW (ref 27.2–33.4)
MCHC: 32.7 g/dL (ref 32.0–36.0)
MCV: 81.3 fL (ref 79.3–98.0)
MONO ABS: 0.5 10*3/uL (ref 0.1–0.9)
MONOS PCT: 7 %
NEUTROS ABS: 5.3 10*3/uL (ref 1.5–6.5)
Neutrophils Relative %: 79 %
Platelet Count: 251 10*3/uL (ref 140–400)
RBC: 5.68 MIL/uL (ref 4.20–5.82)
RDW: 19 % — AB (ref 11.0–14.6)
WBC Count: 6.7 10*3/uL (ref 4.0–10.3)

## 2017-09-27 LAB — CMP (CANCER CENTER ONLY)
ALBUMIN: 2.9 g/dL — AB (ref 3.5–5.0)
ALT: 37 U/L (ref 0–55)
AST: 86 U/L — AB (ref 5–34)
Alkaline Phosphatase: 147 U/L (ref 40–150)
Anion gap: 9 (ref 3–11)
BUN: 18 mg/dL (ref 7–26)
CHLORIDE: 101 mmol/L (ref 98–109)
CO2: 27 mmol/L (ref 22–29)
Calcium: 9.6 mg/dL (ref 8.4–10.4)
Creatinine: 0.73 mg/dL (ref 0.70–1.30)
GFR, Est AFR Am: >60 mL/min (ref >60)
GLUCOSE: 95 mg/dL (ref 70–140)
POTASSIUM: 4.5 mmol/L (ref 3.5–5.1)
Sodium: 137 mmol/L (ref 136–145)
Total Bilirubin: 1.6 mg/dL — ABNORMAL HIGH (ref 0.2–1.2)
Total Protein: 7.9 g/dL (ref 6.4–8.3)

## 2017-09-27 LAB — APTT: APTT: 39 s — AB (ref 24–36)

## 2017-09-27 LAB — PROTIME-INR
INR: 1.06
PROTHROMBIN TIME: 13.7 s (ref 11.4–15.2)

## 2017-09-27 MED ORDER — OXYCODONE-ACETAMINOPHEN 5-325 MG PO TABS: 1.0000 | ORAL_TABLET | Freq: Four times a day (QID) | ORAL | 0 refills | Status: DC | PRN

## 2017-09-27 NOTE — Telephone Encounter (Signed)
Spoke with pt to inform that Amicar rinse is unavailable at surrounding pharmacies. Per Dr. Benay Spice, hold on Amicar and pt to continue saline swish and spit and hold lovenox. Pt voiced understanding

## 2017-09-27 NOTE — Progress Notes (Signed)
[facility] OFFICE PROGRESS NOTE   Diagnosis: Hepatocellular carcinoma  INTERVAL HISTORY:   Wayne Rose returns for a scheduled visit.  He continues sorafenib and Lovenox.  No diarrhea.  The right leg pain is slowly improving. He has developed a "ulcer "and pain at the right right lower gum.  The area is bleeding.  He also reports mild nose bleeding. He continues oxycodone as needed for pain.  Nausea is relieved with ondansetron.  Objective:  Vital signs in last 24 hours:  Blood pressure (!) 134/96, pulse (!) 107, temperature 98.6 F (37 C), temperature source Oral, resp. rate (!) 22, height 5' 10.5" (1.791 m), weight 161 lb 14.4 oz (73.4 kg), SpO2 91 %.    HEENT: No thrush.  There is bright red blood throughout the oral cavity that appears to be emanating from a mass lesion at the right lower anterior gumline measuring approximately 2-3 centimeters in maximum dimension.  The lesion is firm.  No oral ulcers. Resp: Distant breath sounds, no respiratory distress Cardio: Regular rate and rhythm GI: No hepatomegaly, nontender Vascular: No leg edema  Skin: Ecchymoses at injection sites over the lower abdominal wall, resolving ecchymosis at the right lower leg   Lab Results:  Lab Results  Component Value Date   WBC 6.7 09/27/2017   HGB 15.1 09/27/2017   HCT 46.2 09/27/2017   MCV 81.3 09/27/2017   PLT 251 09/27/2017   NEUTROABS 5.3 09/27/2017  PT 13.7, PTT 39  CMP  Lab Results  Component Value Date   NA 137 09/27/2017   K 4.5 09/27/2017   CL 101 09/27/2017   CO2 27 09/27/2017   GLUCOSE 95 09/27/2017   BUN 18 09/27/2017   CREATININE 0.73 09/27/2017   CALCIUM 9.6 09/27/2017   PROT 7.9 09/27/2017   ALBUMIN 2.9 (L) 09/27/2017   AST 86 (H) 09/27/2017   ALT 37 09/27/2017   ALKPHOS 147 09/27/2017   BILITOT 1.6 (H) 09/27/2017   GFRNONAA >60 09/27/2017   GFRAA >60 09/27/2017     Medications: I have reviewed the patient's current  medications.   Assessment/Plan: 1. Multifocal hepatocellular carcinoma  Abdominal ultrasound 08/08/2017-multiple mass lesions of varying echogenicity within the liver with the largest within the right lobe appearing to extend into the intrahepatic IVC. Mild ascites right upper quadrant.  08/08/2017 AFP elevated225.9  MRI abdomen 08/11/2017-cirrhosis with multifocal hepatocellular carcinoma, dominant lesion right hepatic lobe 16.5 cm. Dominant lesion invades the IVC with tumor thrombus in the IVC extending up toward and potentially into the lower portion of the right atrium. Mild perihepatic ascites.  CTs4/16/2019-multifocal hepatocellular carcinoma. Right-sided tumor extension into the IVC and inferior right atrium. Moderate to large volume bilateral pulmonary emboli. Thoracic adenopathy suspicious for metastatic disease. Nonspecific bilateral pulmonary nodules. Similar small volume of abdominopelvic ascites.  Sorafenib started 08/31/2017  Sorafenib placed on hold 09/27/2017 2. Hepatitis C status post treatment with Epclusa 3. Exertional dyspnea probably secondary to COPD, question HCC involving the lungs, question pleural effusion 4. Abdominal pain secondary to #1 5. Bilateral pulmonary emboli on chest CT 08/20/2017. Lovenox initiated 08/21/2017.  Lovenox placed on hold 09/27/2017 6. Fall 08/22/2017-hematoma right gluteus 7. Bleeding mass lesion at the right lower gumline 09/27/2017   Disposition: Wayne Rose has advanced stage hepatocellular carcinoma.  He has been maintained on sorafenib for approximately 1 month.  His overall performance status has declined.  He appears to have a mass lesion in the oral cavity with associated bleeding and pain.  He will continue  oxycodone as needed for pain.  We placed the sorafenib on hold.  He will also hold Lovenox anticoagulation.  He will begin Amicar rinse.  We will make a referral to dental medicine for evaluation of the oral mass.  [name] will return for an office visit on 10/02/2017.  40 minutes were spent with the patient today.  The majority of the time was used for counseling and coordination of care.  Betsy Coder, MD  09/27/2017  10:53 AM

## 2017-09-27 NOTE — Telephone Encounter (Signed)
Scheduled appt per 5/24 los - pt is aware of appt date and time.

## 2017-10-02 ENCOUNTER — Inpatient Hospital Stay: Payer: Medicare Other | Admitting: Oncology

## 2017-10-02 ENCOUNTER — Ambulatory Visit (HOSPITAL_COMMUNITY): Payer: Self-pay | Admitting: Dentistry

## 2017-10-02 ENCOUNTER — Telehealth: Payer: Self-pay | Admitting: Oncology

## 2017-10-02 ENCOUNTER — Encounter (HOSPITAL_COMMUNITY): Payer: Self-pay | Admitting: Dentistry

## 2017-10-02 VITALS — BP 121/84 | HR 106 | Temp 97.9°F

## 2017-10-02 DIAGNOSIS — M79604 Pain in right leg: Secondary | ICD-10-CM | POA: Diagnosis not present

## 2017-10-02 DIAGNOSIS — R918 Other nonspecific abnormal finding of lung field: Secondary | ICD-10-CM | POA: Diagnosis not present

## 2017-10-02 DIAGNOSIS — Z7189 Other specified counseling: Secondary | ICD-10-CM | POA: Insufficient documentation

## 2017-10-02 DIAGNOSIS — K0889 Other specified disorders of teeth and supporting structures: Secondary | ICD-10-CM

## 2017-10-02 DIAGNOSIS — K0530 Chronic periodontitis, unspecified: Secondary | ICD-10-CM

## 2017-10-02 DIAGNOSIS — K7469 Other cirrhosis of liver: Secondary | ICD-10-CM

## 2017-10-02 DIAGNOSIS — K121 Other forms of stomatitis: Secondary | ICD-10-CM

## 2017-10-02 DIAGNOSIS — R188 Other ascites: Secondary | ICD-10-CM

## 2017-10-02 DIAGNOSIS — C220 Liver cell carcinoma: Secondary | ICD-10-CM

## 2017-10-02 DIAGNOSIS — K08409 Partial loss of teeth, unspecified cause, unspecified class: Secondary | ICD-10-CM

## 2017-10-02 DIAGNOSIS — I2699 Other pulmonary embolism without acute cor pulmonale: Secondary | ICD-10-CM | POA: Diagnosis not present

## 2017-10-02 DIAGNOSIS — J449 Chronic obstructive pulmonary disease, unspecified: Secondary | ICD-10-CM

## 2017-10-02 DIAGNOSIS — I2609 Other pulmonary embolism with acute cor pulmonale: Secondary | ICD-10-CM

## 2017-10-02 DIAGNOSIS — G893 Neoplasm related pain (acute) (chronic): Secondary | ICD-10-CM

## 2017-10-02 DIAGNOSIS — K030 Excessive attrition of teeth: Secondary | ICD-10-CM

## 2017-10-02 DIAGNOSIS — K036 Deposits [accretions] on teeth: Secondary | ICD-10-CM

## 2017-10-02 DIAGNOSIS — M264 Malocclusion, unspecified: Secondary | ICD-10-CM

## 2017-10-02 DIAGNOSIS — K06020 Generalized gingival recession, unspecified: Secondary | ICD-10-CM

## 2017-10-02 MED ORDER — ENOXAPARIN SODIUM 100 MG/ML ~~LOC~~ SOLN: 100.0000 mg | Freq: Every day | SUBCUTANEOUS | 3 refills | Status: AC

## 2017-10-02 MED ORDER — ONDANSETRON 4 MG PO TBDP: ORAL_TABLET | ORAL | 0 refills | Status: DC

## 2017-10-02 MED ORDER — CHLORHEXIDINE GLUCONATE 0.12 % MT SOLN: OROMUCOSAL | 99 refills | Status: DC

## 2017-10-02 MED FILL — ONDANSETRON ODT 4 MG TABLET: 4 | 10 days supply | Qty: 60 | Fill #0

## 2017-10-02 MED FILL — ENOXAPARIN 100 MG/ML SYR: 100 | 30 days supply | Qty: 30 | Fill #0

## 2017-10-02 NOTE — Patient Instructions (Signed)
Patient is use chlorhexidine rinses 3 times daily in a swish and spit manner. A prescription for chlorhexidine was sent to his pharmacy with refills for one year. Patient is a follow-up with Dr. Frederik Schmidt, oral surgeon, tomorrow at noon.   Dr. Enrique Sack

## 2017-10-02 NOTE — Progress Notes (Signed)
DENTAL CONSULTATION  Date of Consultation:  10/02/2017 Patient Name:   Wayne Rose Date of Birth:   24-Sep-1950 Medical Record Number: [id number]  VITALS: BP 121/84 (BP Location: Right Arm)   Pulse (!) 106   Temp 97.9 F (36.6 C)   CHIEF COMPLAINT: The patient was referred by Dr. Benay Spice for a dental consultation.  HPI: Wayne Rose is a 67 year old male recently diagnosed with hepatocellular carcinoma.  Patient has been receiving Nexavar oral chemotherapy treatment for approximately 3 weeks under the care of Dr. Benay Spice.  Patient also has been receiving Lovenox therapy for bilateral pulmonary emboli noted on CT scan of 08/20/2017.  The patient subsequently developed facial swelling and ulceration in the area of #27 on the facial aspect. This area will also had significant bleeding by patient report. Patient was then seen by Dr. Benay Spice to discontinue the Lovenox therapy and the Nexavar therapy on 09/27/2017. Patient indicates that the area stopped bleeding within 2 days of discontinuation of the Lovenox therapy.  The patient indicates the ulceration has persisted. Patient is scheduled to see Dr. Benay Spice for follow-up later this afternoon.  Patient gives a history of having of upper right molar tooth pain approximately 09/12/2017.this is sharp and achy pain and lasted minutes only. This then discontinued and the pain presented in the lower right area #27. Patient describes the pain as being achy in nature and recent intensity of 5 out of 10 but is currently less than 1 out of 10 today.  The patient indicates that the pain lasted intermittently for 20-30 minutes at a time.  The area also started to swell by patient report and patient subsequently had bleeding from that area.the patient was then seen by Dr. Benay Spice who discontinued the Lovenox and Nexavar therapies. Patient indicates that the bleeding was salt within 2 days. The discomfort has been minimal since that time.  Patient thinks that  the area is still ulcerated.  The patient last saw his primary dentist, Dr. Lavonne Chick, in 2016 for placement of the crown on the previously placed implant in the area of #11.  The patient is currently seeking a new primary dentist closer to home.  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   PROBLEM LIST: Patient Active Problem List   Diagnosis Date Noted  . Hepatocellular carcinoma ([place]) 10/02/2017    PMH: Past Medical History:  Diagnosis Date  . Hepatitis C    Treatment 2017- Cleared per patient    PSH: Past Surgical History:  Procedure Laterality Date  . SHOULDER ARTHROSCOPY WITH SUBACROMIAL DECOMPRESSION Left 2014    ALLERGIES: No Known Allergies  MEDICATIONS: Current Outpatient Medications  Medication Sig Dispense Refill  . magic mouthwash SOLN Take 10 mLs by mouth 4 (four) times daily as needed for mouth pain. (Patient not taking: Reported on 10/02/2017) 240 mL 0  . ondansetron (ZOFRAN ODT) 4 MG disintegrating tablet 4mg  ODT q4 hours prn nausea/vomit 60 tablet 0  . oxyCODONE-acetaminophen (PERCOCET/ROXICET) 5-325 MG tablet Take 1 tablet by mouth every 6 (six) hours as needed for severe pain. 60 tablet 0  . chlorhexidine (PERIDEX) 0.12 % solution Rinse with 15 mls three times daily for 30 seconds. Use after breakfast, lunch, and at bedtime. Spit out excess. Do not swallow. (Patient not taking: Reported on 10/02/2017) 960 mL prn  . enoxaparin (LOVENOX) 80 MG/0.8ML injection Inject 0.8 mLs (80 mg total) into the skin every 12 (twelve) hours. (Patient not taking: Reported on 10/02/2017) 60 Syringe 3  .  SORAfenib (NEXAVAR) 200 MG tablet Take 2 tablets (400mg ) by mouth in AM & 1 tablet (200mg ) in PM. Give on an empty stomach 1 hour before or 2 hours after meals. (Patient not taking: Reported on 10/02/2017) 90 tablet 0   No current facility-administered medications for this visit.     LABS: Lab Results  Component Value Date   WBC 6.7 09/27/2017   HGB 15.1 09/27/2017    HCT 46.2 09/27/2017   MCV 81.3 09/27/2017   PLT 251 09/27/2017      Component Value Date/Time   NA 137 09/27/2017 1003   K 4.5 09/27/2017 1003   CL 101 09/27/2017 1003   CO2 27 09/27/2017 1003   GLUCOSE 95 09/27/2017 1003   BUN 18 09/27/2017 1003   CREATININE 0.73 09/27/2017 1003   CALCIUM 9.6 09/27/2017 1003   GFRNONAA >60 09/27/2017 1003   GFRAA >60 09/27/2017 1003   Lab Results  Component Value Date   INR 1.06 09/27/2017   No results found for: PTT  SOCIAL HISTORY: Social History   Socioeconomic History  . Marital status: Single    Spouse name: Not on file  . Number of children: Not on file  . Years of education: Not on file  . Highest education level: Not on file  Occupational History  . Not on file  Social Needs  . Financial resource strain: Not on file  . Food insecurity:    Worry: Not on file    Inability: Not on file  . Transportation needs:    Medical: Not on file    Non-medical: Not on file  Tobacco Use  . Smoking status: Former Smoker    Years: 50.00    Types: Cigarettes    Last attempt to quit: 2018    Years since quitting: 1.4  . Smokeless tobacco: Never Used  . Tobacco comment: Uses Nicotine Gum currently  Substance and Sexual Activity  . Alcohol use: Never    Frequency: Never  . Drug use: Never  . Sexual activity: Never  Lifestyle  . Physical activity:    Days per week: Not on file    Minutes per session: Not on file  . Stress: Not on file  Relationships  . Social connections:    Talks on phone: Not on file    Gets together: Not on file    Attends religious service: Not on file    Active member of club or organization: Not on file    Attends meetings of clubs or organizations: Not on file    Relationship status: Not on file  . Intimate partner violence:    Fear of current or ex partner: Not on file    Emotionally abused: Not on file    Physically abused: Not on file    Forced sexual activity: Not on file  Other Topics Concern   . Not on file  Social History Narrative  . Not on file     FAMILY HISTORY: Family History  Problem Relation Age of Onset  . Lung cancer Sister     REVIEW OF SYSTEMS: Reviewed with the patient as per History of present illness. Psych: Patient has a history of dental phobia.  DENTAL HISTORY: CHIEF COMPLAINT: The patient was referred by Dr. Benay Spice for a dental consultation.  HPI: Wayne Rose is a 67 year old male recently diagnosed with hepatocellular carcinoma.  Patient has been receiving Nexavar oral chemotherapy treatment for approximately 3 weeks under the care of Dr. Benay Spice.  Patient also  has been receiving Lovenox therapy for bilateral pulmonary emboli noted on CT scan of 08/20/2017.  The patient subsequently developed facial swelling and ulceration in the area of #27 on the facial aspect. This area will also had significant bleeding by patient report. Patient was then seen by Dr. Benay Spice to discontinue the Lovenox therapy and the Nexavar therapy on 09/27/2017. Patient indicates that the area stopped bleeding within 2 days of discontinuation of the Lovenox therapy.  The patient indicates the ulceration has persisted. Patient is scheduled to see Dr. Benay Spice for follow-up later this afternoon.  Patient gives a history of having of upper right molar tooth pain approximately 09/12/2017.this is sharp and achy pain and lasted minutes only. This then discontinued and the pain presented in the lower right area #27. Patient describes the pain as being achy in nature and recent intensity of 5 out of 10 but is currently less than 1 out of 10 today.  The patient indicates that the pain lasted intermittently for 20-30 minutes at a time.  The area also started to swell by patient report and patient subsequently had bleeding from that area.the patient was then seen by Dr. Benay Spice who discontinued the Lovenox and Nexavar therapies. Patient indicates that the bleeding was salt within 2 days. The  discomfort has been minimal since that time.  Patient thinks that the area is still ulcerated.  The patient last saw his primary dentist, Dr. Lavonne Chick, in 2016 for placement of the crown on the previously placed implant in the area of #11.  The patient is currently seeking a new primary dentist closer to home.  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  DENTAL EXAMINATION: GENERAL:  The patient is a tall, well-developed male in no acute distress. HEAD AND NECK:  There is no palpable neck lymphadenopathy. The patient denies acute TMJ symptoms. INTRAORAL EXAM:  The patient has normal saliva. There is a large ulcerated lesion measuring 18 mm x 4 mm on the Facial aspect of tooth numbers 26 through 28 an involving the buccal vestibule. There appears to be exposed bone noted. DENTITION:  The patient is missing tooth numbers 1, 11, 16, 17, 31, and 32. There is lower anterior crowding. There is evidence of incisal attrition. PERIODONTAL:  The patient has chronic periodontitis with plaque and calculus ccumulations, generalized gingival recession, and mandibular anterior incipient tooth mobility. DENTAL CARIES/SUBOPTIMAL RESTORATIONS:  No extensive dental caries are noted at this time. Patient would need a full series of dental radiographs to identify other incipient dental caries. ENDODONTIC:  Patient does have a history of toothache/gum discomfort in the area of #27. Not sure if this represents pain related to the oral ulceration or if there is periapical pathology. Tooth numbers 27 has had a previous root canal therapy. Tooth numbers 26 and 28 do not appear to have any periapical radiolucencies.patient has had multiple previous root canal therapies involving 4, 5, 12, 13, 14, 19, 22, 27, and 30. CROWN AND BRIDGE:  There are multiple crown or bridge restorations noted. PROSTHODONTIC:  The patient does not have any partial dentures. IMPLANT: The patient has previously had an implant in the area of  #11 placed and restored with a PFM crown. OCCLUSION:  Patient is a poor occlusal scheme but a stable occlusion.  RADIOGRAPHIC INTERPRETATION: An orthopantogram was taken and supplemented with 3 lower periapical radiographs. There are multiple missing teeth.  There is incipient to moderate bone loss noted.There is an implant in the area of #11.  There are multiple teeth numbers 4, 5, 12, 13, 14, 19, 22, 27, and 30 with previous root canal therapies. There are multiple amalgam, resin, and crown and bridge restorations noted.  There are no obvious periapical radiolucency associated with tooth numbers 26 and 28. There is a diffuse darkness around the lower third of the root structure of tooth #27 of unknown etiology.  ASSESSMENTS: 1. Hepatocellular carcinoma. 2. History of chemotherapy with Nexavar-discontinued on 08/28/2017. 3. History of Lovenox therapy to treat the bilateral pulmonary embolism that was discontinued on 08/28/2017. 4. Oral ulceration on the facial aspect of tooth numbers 26-28 And involving the facial vestibule with probable bony involvement. 5. Chronic periodontitis of bone loss 6. Accretions 7. Generalized gingival recession 8. Mandibular anterior incipient tooth mobility 9. Multiple missing teeth 10. Incisal attrition 11. Lower anterior crowding 12. Poor occlusal scheme and malocclusion  PLAN/RECOMMENDATIONS: 1. I discussed the dental findings with the patient.  I am unsure of the exact etiology of the oral ulceration present in the area #20 - 28. This may represent a severe mucositis reaction to the Nexavar chemotherapy treatment.  This may represent a chronic periapical infection that resulted in facial swelling and spontaneous drainage that resulted in the persistent ulceration. This may also represent some other etiology with questionable need for biopsy.  I have currently arranged for a dental consultation with the oral surgeon, Dr. Frederik Schmidt, for tomorrow at 12 noon to  provide additional input on this ulceration with biopsy of the area as needed.  In the meantime, I have prescribed chlorhexidine rinses to use 3 times daily in a swish and spit manner.  I will have Dr. Benson Norway prescribe antibiotic therapy if he feels this is indicated at this time.  I have contacted Dr. Benay Spice who will discontinue the Nexavar chemotherapy until further evaluation and resolution of the ulceration.  I have also recommended that the patient ultimately seek follow-up with a new primary dentist of his choice for periodontal maintenance procedures and overall management of his dental care.  2. Discussion of findings with medical team and coordination of future medical and dental care as needed.  I spent in excess of  120 minutes during the conduct of this consultation and >50% of this time involved direct face-to-face encounter for counseling and/or coordination of the patient's care.    Lenn Cal, DDS

## 2017-10-02 NOTE — Telephone Encounter (Signed)
Appointment scheduled added patient to infusion book due to capacity on 6/5/  AVS/Calendar printed per 5/29 los

## 2017-10-02 NOTE — Progress Notes (Signed)
[facility] OFFICE PROGRESS NOTE   Diagnosis: Hepatocellular carcinoma  INTERVAL HISTORY:    Mr. Granja returns as scheduled.  No further bleeding.  He feels better.  The right leg pain and swelling have improved. He saw Dr. Enrique Sack earlier today.  He is scheduled for an appointment with oral surgery tomorrow.  Dr. Enrique Sack feels he may have bled in an area of mucositis.  Objective:  Vital signs in last 24 hours:  Blood pressure 120/90, pulse 92, temperature 98 F (36.7 C), temperature source Oral, resp. rate 18, height 5' 10.5" (1.791 m), weight 165 lb 6.4 oz (75 kg), SpO2 97 %.    HEENT: No thrush.  No bleeding.  Large ulceration at the right lower anterior gumline.  No mass. Resp: Decreased breath sounds at the right base, no respiratory distress Cardio: Regular rate and rhythm GI: No hepatomegaly, nontender Vascular: No leg edema Skin: Ecchymoses over the abdominal wall    Lab Results:  Lab Results  Component Value Date   WBC 6.7 09/27/2017   HGB 15.1 09/27/2017   HCT 46.2 09/27/2017   MCV 81.3 09/27/2017   PLT 251 09/27/2017   NEUTROABS 5.3 09/27/2017    CMP  Lab Results  Component Value Date   NA 137 09/27/2017   K 4.5 09/27/2017   CL 101 09/27/2017   CO2 27 09/27/2017   GLUCOSE 95 09/27/2017   BUN 18 09/27/2017   CREATININE 0.73 09/27/2017   CALCIUM 9.6 09/27/2017   PROT 7.9 09/27/2017   ALBUMIN 2.9 (L) 09/27/2017   AST 86 (H) 09/27/2017   ALT 37 09/27/2017   ALKPHOS 147 09/27/2017   BILITOT 1.6 (H) 09/27/2017   GFRNONAA >60 09/27/2017   GFRAA >60 09/27/2017     Medications: I have reviewed the patient's current medications.   Assessment/Plan: 1. Multifocal hepatocellular carcinoma  Abdominal ultrasound 08/08/2017-multiple mass lesions of varying echogenicity within the liver with the largest within the right lobe appearing to extend into the intrahepatic IVC. Mild ascites right upper quadrant.  08/08/2017 AFP  elevated225.9  MRI abdomen 08/11/2017-cirrhosis with multifocal hepatocellular carcinoma, dominant lesion right hepatic lobe 16.5 cm. Dominant lesion invades the IVC with tumor thrombus in the IVC extending up toward and potentially into the lower portion of the right atrium. Mild perihepatic ascites.  CTs4/16/2019-multifocal hepatocellular carcinoma. Right-sided tumor extension into the IVC and inferior right atrium. Moderate to large volume bilateral pulmonary emboli. Thoracic adenopathy suspicious for metastatic disease. Nonspecific bilateral pulmonary nodules. Similar small volume of abdominopelvic ascites.  Sorafenib started 08/31/2017  Sorafenib placed on hold 09/27/2017 2. Hepatitis C status post treatment with Epclusa 3. Exertional dyspnea probably secondary to COPD, question HCC involving the lungs, question pleural effusion 4. Abdominal pain secondary to #1 5. Bilateral pulmonary emboli on chest CT 08/20/2017. Lovenox initiated 08/21/2017.  Lovenox placed on hold 09/27/2017 6. Fall 08/22/2017-hematoma right gluteus 7. Bleeding mass lesion at the right lower gumline 09/27/2017- large right lower gumline ulceration noted 10/02/2017, no remaining mass, no bleeding   Disposition:  Mr.Weppler has an improved performance status today.  The bleeding last week was likely related to mucositis while on Lovenox.  A large "mass "overlying an area of ulceration at the right lower gumline has resolved.  I am concerned the ulcer is related to sorafenib.  Sorafenib will be continued.  We discussed supportive care versus a trial of second line therapy.  He would like to proceed with second line systemic therapy.  I recommend nivolumab.  We reviewed  potential toxicities associated with nivolumab including the chance for diarrhea, rash, arthritis, hepatitis, and endocrinopathies.  He agrees to proceed.  He will be scheduled for an office visit in the first cycle of nivolumab on 10/09/2017.  He will  resume Lovenox anticoagulation with once daily dosing.  25 minutes were spent with the patient today.  The majority of the time was used for counseling and coordination of care.  Betsy Coder, MD  10/02/2017  1:09 PM

## 2017-10-02 NOTE — Progress Notes (Signed)
START OFF PATHWAY REGIMEN - Other Dx   [id number]:Nivolumab 240 mg q14 Days:   A cycle is every 14 days:     Nivolumab   **Always confirm dose/schedule in your pharmacy ordering system**  Patient Characteristics: Intent of Therapy: Non-Curative / Palliative Intent, Discussed with Patient 

## 2017-10-03 ENCOUNTER — Telehealth: Payer: Self-pay | Admitting: *Deleted

## 2017-10-03 NOTE — Telephone Encounter (Signed)
Received call from pt with an update for Dr Benay Spice.  He states that he had a f/u with oral surgeon & he has two infected teeth & is scheduled for extraction next tues & states that is the only way to solve the problem & he thinks that this is just coincidental to any treatment.  Message routed to Dr Sherrill/Pod RN

## 2017-10-06 ENCOUNTER — Other Ambulatory Visit: Payer: Self-pay | Admitting: Oncology

## 2017-10-09 ENCOUNTER — Inpatient Hospital Stay: Payer: Medicare Other | Attending: Nurse Practitioner | Admitting: Nurse Practitioner

## 2017-10-09 ENCOUNTER — Encounter: Payer: Self-pay | Admitting: Nurse Practitioner

## 2017-10-09 ENCOUNTER — Telehealth: Payer: Self-pay | Admitting: Nurse Practitioner

## 2017-10-09 ENCOUNTER — Inpatient Hospital Stay: Payer: Medicare Other

## 2017-10-09 VITALS — HR 82

## 2017-10-09 VITALS — BP 118/89 | HR 107 | Temp 98.1°F | Resp 17 | Ht 70.5 in | Wt 164.9 lb

## 2017-10-09 DIAGNOSIS — R188 Other ascites: Secondary | ICD-10-CM | POA: Diagnosis not present

## 2017-10-09 DIAGNOSIS — Z5112 Encounter for antineoplastic immunotherapy: Secondary | ICD-10-CM | POA: Diagnosis not present

## 2017-10-09 DIAGNOSIS — R51 Headache: Secondary | ICD-10-CM

## 2017-10-09 DIAGNOSIS — I2699 Other pulmonary embolism without acute cor pulmonale: Secondary | ICD-10-CM | POA: Diagnosis not present

## 2017-10-09 DIAGNOSIS — K068 Other specified disorders of gingiva and edentulous alveolar ridge: Secondary | ICD-10-CM

## 2017-10-09 DIAGNOSIS — C220 Liver cell carcinoma: Secondary | ICD-10-CM

## 2017-10-09 DIAGNOSIS — Z79899 Other long term (current) drug therapy: Secondary | ICD-10-CM | POA: Diagnosis not present

## 2017-10-09 DIAGNOSIS — R110 Nausea: Secondary | ICD-10-CM

## 2017-10-09 MED ORDER — SODIUM CHLORIDE 0.9 % IV SOLN
Freq: Once | INTRAVENOUS | Status: AC
  Administered 2017-10-09: 15:00:00 via INTRAVENOUS

## 2017-10-09 MED ORDER — SODIUM CHLORIDE 0.9 % IV SOLN
240.0000 mg | Freq: Once | INTRAVENOUS | Status: AC
  Administered 2017-10-09: 240 mg via INTRAVENOUS
  Filled 2017-10-09: qty 24

## 2017-10-09 NOTE — Patient Instructions (Signed)
[facility] Discharge Instructions for Patients Receiving Chemotherapy  Today you received the following chemotherapy agents Opdivo  To help prevent nausea and vomiting after your treatment, we encourage you to take your nausea medication as directed.   If you develop nausea and vomiting that is not controlled by your nausea medication, call the clinic.   BELOW ARE SYMPTOMS THAT SHOULD BE REPORTED IMMEDIATELY:  *FEVER GREATER THAN 100.5 F  *CHILLS WITH OR WITHOUT FEVER  NAUSEA AND VOMITING THAT IS NOT CONTROLLED WITH YOUR NAUSEA MEDICATION  *UNUSUAL SHORTNESS OF BREATH  *UNUSUAL BRUISING OR BLEEDING  TENDERNESS IN MOUTH AND THROAT WITH OR WITHOUT PRESENCE OF ULCERS  *URINARY PROBLEMS  *BOWEL PROBLEMS  UNUSUAL RASH Items with * indicate a potential emergency and should be followed up as soon as possible.  Feel free to call the clinic should you have any questions or concerns. The clinic phone number is [phone].  Please show the [place] at check-in to the Emergency Department and triage nurse.   Nivolumab injection What is this medicine? NIVOLUMAB (nye VOL ue mab) is a monoclonal antibody. It is used to treat melanoma, lung cancer, kidney cancer, head and neck cancer, Hodgkin lymphoma, urothelial cancer, colon cancer, and liver cancer. This medicine may be used for other purposes; ask your health care provider or pharmacist if you have questions. COMMON BRAND NAME(S): Opdivo What should I tell my health care provider before I take this medicine? They need to know if you have any of these conditions: -diabetes -immune system problems -kidney disease -liver disease -lung disease -organ transplant -stomach or intestine problems -thyroid disease -an unusual or allergic reaction to nivolumab, other medicines, foods, dyes, or preservatives -pregnant or trying to get pregnant -breast-feeding How should I use this medicine? This medicine is  for infusion into a vein. It is given by a health care professional in a hospital or clinic setting. A special MedGuide will be given to you before each treatment. Be sure to read this information carefully each time. Talk to your pediatrician regarding the use of this medicine in children. While this drug may be prescribed for children as young as 12 years for selected conditions, precautions do apply. Overdosage: If you think you have taken too much of this medicine contact a poison control center or emergency room at once. NOTE: This medicine is only for you. Do not share this medicine with others. What if I miss a dose? It is important not to miss your dose. Call your doctor or health care professional if you are unable to keep an appointment. What may interact with this medicine? Interactions have not been studied. Give your health care provider a list of all the medicines, herbs, non-prescription drugs, or dietary supplements you use. Also tell them if you smoke, drink alcohol, or use illegal drugs. Some items may interact with your medicine. This list may not describe all possible interactions. Give your health care provider a list of all the medicines, herbs, non-prescription drugs, or dietary supplements you use. Also tell them if you smoke, drink alcohol, or use illegal drugs. Some items may interact with your medicine. What should I watch for while using this medicine? This drug may make you feel generally unwell. Continue your course of treatment even though you feel ill unless your doctor tells you to stop. You may need blood work done while you are taking this medicine. Do not become pregnant while taking this medicine or for 5 months after  stopping it. Women should inform their doctor if they wish to become pregnant or think they might be pregnant. There is a potential for serious side effects to an unborn child. Talk to your health care professional or pharmacist for more information.  Do not breast-feed an infant while taking this medicine. What side effects may I notice from receiving this medicine? Side effects that you should report to your doctor or health care professional as soon as possible: -allergic reactions like skin rash, itching or hives, swelling of the face, lips, or tongue -black, tarry stools -blood in the urine -bloody or watery diarrhea -changes in vision -change in sex drive -changes in emotions or moods -chest pain -confusion -cough -decreased appetite -diarrhea -facial flushing -feeling faint or lightheaded -fever, chills -hair loss -hallucination, loss of contact with reality -headache -irritable -joint pain -loss of memory -muscle pain -muscle weakness -seizures -shortness of breath -signs and symptoms of high blood sugar such as dizziness; dry mouth; dry skin; fruity breath; nausea; stomach pain; increased hunger or thirst; increased urination -signs and symptoms of kidney injury like trouble passing urine or change in the amount of urine -signs and symptoms of liver injury like dark yellow or brown urine; general ill feeling or flu-like symptoms; light-colored stools; loss of appetite; nausea; right upper belly pain; unusually weak or tired; yellowing of the eyes or skin -stiff neck -swelling of the ankles, feet, hands -weight gain Side effects that usually do not require medical attention (report to your doctor or health care professional if they continue or are bothersome): -bone pain -constipation -tiredness -vomiting This list may not describe all possible side effects. Call your doctor for medical advice about side effects. You may report side effects to FDA at 1-800-FDA-1088. Where should I keep my medicine? This drug is given in a hospital or clinic and will not be stored at home. NOTE: This sheet is a summary. It may not cover all possible information. If you have questions about this medicine, talk to your doctor,  pharmacist, or health care provider.  2018 Elsevier/Gold Standard (2016-01-30 17:49:34)

## 2017-10-09 NOTE — Progress Notes (Addendum)
[facility] OFFICE PROGRESS NOTE   Diagnosis: Hepatocellular carcinoma  INTERVAL HISTORY:   Wayne Rose returns as scheduled.  He is feeling better.  No further mouth bleeding.  He reports 2 teeth were extracted yesterday.  Pain and bruising along the right side "99% better".  He tends to have nausea and a headache when he wakes up each morning.  He takes a nausea pill and pain medication.  Objective:  Vital signs in last 24 hours:  Blood pressure 118/89, pulse (!) 107, temperature 98.1 F (36.7 C), temperature source Oral, resp. rate 17, height 5' 10.5" (1.791 m), weight 164 lb 14.4 oz (74.8 kg), SpO2 97 %.    HEENT: Extraction site lower anterior gumline is without bleeding.  Healing ulceration right lower anterior gumline.  No mass. Resp: Distant breath sounds.  No respiratory distress. Cardio: Regular rate and rhythm. GI: Abdomen soft and nontender.  No hepatomegaly. Vascular: No leg edema.  Skin: Faint ecchymosis right abdominal wall.   Lab Results:  Lab Results  Component Value Date   WBC 6.7 09/27/2017   HGB 15.1 09/27/2017   HCT 46.2 09/27/2017   MCV 81.3 09/27/2017   PLT 251 09/27/2017   NEUTROABS 5.3 09/27/2017    Imaging:  No results found.  Medications: I have reviewed the patient's current medications.  Assessment/Plan: 1. Multifocal hepatocellular carcinoma  Abdominal ultrasound 08/08/2017-multiple mass lesions of varying echogenicity within the liver with the largest within the right lobe appearing to extend into the intrahepatic IVC. Mild ascites right upper quadrant.  08/08/2017 AFP elevated225.9  MRI abdomen 08/11/2017-cirrhosis with multifocal hepatocellular carcinoma, dominant lesion right hepatic lobe 16.5 cm. Dominant lesion invades the IVC with tumor thrombus in the IVC extending up toward and potentially into the lower portion of the right atrium. Mild perihepatic ascites.  CTs4/16/2019-multifocal hepatocellular carcinoma.  Right-sided tumor extension into the IVC and inferior right atrium. Moderate to large volume bilateral pulmonary emboli. Thoracic adenopathy suspicious for metastatic disease. Nonspecific bilateral pulmonary nodules. Similar small volume of abdominopelvic ascites.  Sorafenib started 08/31/2017  Sorafenib placed on hold 09/27/2017  Sorafenib discontinued 10/02/2017  Cycle 1 nivolumab 10/09/2017 2. Hepatitis C status post treatment with Epclusa 3. Exertional dyspnea probably secondary to COPD, question HCC involving the lungs, question pleural effusion 4. Abdominal pain secondary to #1 5. Bilateral pulmonary emboli on chest CT 08/20/2017. Lovenox initiated 08/21/2017.Lovenox placed on hold 09/27/2017 6. Fall 08/22/2017-hematoma right gluteus 7. Bleeding mass lesion at the right lower gumline 09/27/2017- large right lower gumline ulceration noted 10/02/2017, no remaining mass, no bleeding; status post extraction of 2 teeth 10/08/2017.  Ulceration improved 10/09/2017.     Disposition: Wayne Rose appears improved.  We discussed that the ulceration at the right lower anterior gumline is most likely related to sorafenib.  The ulceration appears to be healing.  Sorafenib has been discontinued.  The plan is to begin nivolumab today.  We again reviewed potential toxicities.  He agrees to proceed.  He will return for lab, follow-up and cycle 2 nivolumab in 2 weeks.  He will contact the office in the interim with any problems.  Patient seen with Dr. Benay Spice.    Ned Card ANP/GNP-BC   10/09/2017  11:32 AM This was a shared visit with Ned Card.  Wayne Rose was interviewed and examined.  The anterior gum ulcer appears improved.  We remain suspicious this was related to sorafenib therapy.  He will begin nivolumab today.  Julieanne Manson, MD

## 2017-10-09 NOTE — Telephone Encounter (Signed)
Scheduled appt per 6/5 los - unable to adjust 6/19 appt per los. - logged - will contact patient when appt rescheduled.

## 2017-10-12 ENCOUNTER — Telehealth: Payer: Self-pay | Admitting: Oncology

## 2017-10-12 NOTE — Telephone Encounter (Signed)
Poke with patient regarding appointment change for 7/17 per template change

## 2017-10-20 ENCOUNTER — Other Ambulatory Visit: Payer: Self-pay | Admitting: Oncology

## 2017-10-23 ENCOUNTER — Inpatient Hospital Stay: Payer: Medicare Other | Admitting: Oncology

## 2017-10-23 ENCOUNTER — Inpatient Hospital Stay: Payer: Medicare Other

## 2017-10-23 ENCOUNTER — Inpatient Hospital Stay: Payer: Medicare Other | Admitting: Medical

## 2017-10-23 ENCOUNTER — Other Ambulatory Visit: Payer: Self-pay

## 2017-10-23 ENCOUNTER — Other Ambulatory Visit: Payer: Self-pay | Admitting: Nurse Practitioner

## 2017-10-23 VITALS — BP 96/84 | HR 86 | Temp 98.0°F | Resp 17 | Ht 70.5 in | Wt 166.1 lb

## 2017-10-23 DIAGNOSIS — T8090XA Unspecified complication following infusion and therapeutic injection, initial encounter: Secondary | ICD-10-CM

## 2017-10-23 DIAGNOSIS — R188 Other ascites: Secondary | ICD-10-CM | POA: Diagnosis not present

## 2017-10-23 DIAGNOSIS — K068 Other specified disorders of gingiva and edentulous alveolar ridge: Secondary | ICD-10-CM | POA: Diagnosis not present

## 2017-10-23 DIAGNOSIS — C220 Liver cell carcinoma: Secondary | ICD-10-CM

## 2017-10-23 DIAGNOSIS — R918 Other nonspecific abnormal finding of lung field: Secondary | ICD-10-CM

## 2017-10-23 DIAGNOSIS — Z5112 Encounter for antineoplastic immunotherapy: Secondary | ICD-10-CM | POA: Diagnosis not present

## 2017-10-23 DIAGNOSIS — R51 Headache: Secondary | ICD-10-CM

## 2017-10-23 DIAGNOSIS — I2699 Other pulmonary embolism without acute cor pulmonale: Secondary | ICD-10-CM | POA: Diagnosis not present

## 2017-10-23 DIAGNOSIS — R110 Nausea: Secondary | ICD-10-CM | POA: Diagnosis not present

## 2017-10-23 LAB — CBC WITH DIFFERENTIAL (CANCER CENTER ONLY)
BASOS PCT: 1 %
Basophils Absolute: 0.1 10*3/uL (ref 0.0–0.1)
EOS ABS: 0.1 10*3/uL (ref 0.0–0.5)
EOS PCT: 1 %
HCT: 42.1 % (ref 38.4–49.9)
Hemoglobin: 13.8 g/dL (ref 13.0–17.1)
Lymphocytes Relative: 11 %
Lymphs Abs: 0.8 10*3/uL — ABNORMAL LOW (ref 0.9–3.3)
MCH: 26.5 pg — ABNORMAL LOW (ref 27.2–33.4)
MCHC: 32.8 g/dL (ref 32.0–36.0)
MCV: 80.9 fL (ref 79.3–98.0)
MONO ABS: 1 10*3/uL — AB (ref 0.1–0.9)
MONOS PCT: 13 %
Neutro Abs: 5.5 10*3/uL (ref 1.5–6.5)
Neutrophils Relative %: 74 %
PLATELETS: 292 10*3/uL (ref 140–400)
RBC: 5.2 MIL/uL (ref 4.20–5.82)
RDW: 20.4 % — AB (ref 11.0–14.6)
WBC Count: 7.4 10*3/uL (ref 4.0–10.3)

## 2017-10-23 LAB — CMP (CANCER CENTER ONLY)
ALBUMIN: 3.1 g/dL — AB (ref 3.5–5.0)
ALK PHOS: 188 U/L — AB (ref 40–150)
ALT: 57 U/L — AB (ref 0–55)
AST: 105 U/L — ABNORMAL HIGH (ref 5–34)
Anion gap: 9 (ref 3–11)
BILIRUBIN TOTAL: 1.1 mg/dL (ref 0.2–1.2)
BUN: 22 mg/dL (ref 7–26)
CALCIUM: 9.5 mg/dL (ref 8.4–10.4)
CO2: 25 mmol/L (ref 22–29)
CREATININE: 0.88 mg/dL (ref 0.70–1.30)
Chloride: 103 mmol/L (ref 98–109)
GFR, Est AFR Am: >60 mL/min (ref >60)
GFR, Estimated: >60 mL/min (ref >60)
GLUCOSE: 100 mg/dL (ref 70–140)
Potassium: 3.8 mmol/L (ref 3.5–5.1)
SODIUM: 137 mmol/L (ref 136–145)
TOTAL PROTEIN: 7.5 g/dL (ref 6.4–8.3)

## 2017-10-23 LAB — TSH: TSH: 3.58 u[IU]/mL (ref 0.320–4.118)

## 2017-10-23 MED ORDER — SODIUM CHLORIDE 0.9 % IV SOLN
Freq: Once | INTRAVENOUS | Status: AC
  Administered 2017-10-23: 13:00:00 via INTRAVENOUS

## 2017-10-23 MED ORDER — PROMETHAZINE HCL 12.5 MG PO TABS: 12.5000 mg | ORAL_TABLET | Freq: Four times a day (QID) | ORAL | 2 refills | Status: DC | PRN

## 2017-10-23 MED ORDER — SODIUM CHLORIDE 0.9 % IV SOLN
240.0000 mg | Freq: Once | INTRAVENOUS | Status: AC
  Administered 2017-10-23: 240 mg via INTRAVENOUS
  Filled 2017-10-23: qty 24

## 2017-10-23 NOTE — Progress Notes (Signed)
[facility] OFFICE PROGRESS NOTE   Diagnosis: Hepatocellular carcinoma  INTERVAL HISTORY:   Wayne Rose completed a first treatment with nivolumab on 10/09/2017.  No rash.  He reports one episode of diarrhea.  The gum ulcer is stable.  He underwent tooth extractions by oral surgery. He has a headache and nausea in the mornings.  This is been present for several months.  His symptoms are relieved with oxycodone and ondansetron.  Objective:  Vital signs in last 24 hours:  Blood pressure 96/84, pulse 86, temperature 98 F (36.7 C), temperature source Oral, resp. rate 17, height 5' 10.5" (1.791 m), weight 166 lb 1.6 oz (75.3 kg), SpO2 96 %.    HEENT: No thrush, ulceration at the lower anterior gum Resp: Breath sounds, no respiratory distress Cardio: Regular rate and rhythm GI: Fullness in the right upper abdomen without a discrete liver edge Vascular: No leg edema  Skin: No rash  Portacath/PICC-without erythema  Lab Results:  Lab Results  Component Value Date   WBC 7.4 10/23/2017   HGB 13.8 10/23/2017   HCT 42.1 10/23/2017   MCV 80.9 10/23/2017   PLT 292 10/23/2017   NEUTROABS 5.5 10/23/2017    CMP  Lab Results  Component Value Date   NA 137 10/23/2017   K 3.8 10/23/2017   CL 103 10/23/2017   CO2 25 10/23/2017   GLUCOSE 100 10/23/2017   BUN 22 10/23/2017   CREATININE 0.88 10/23/2017   CALCIUM 9.5 10/23/2017   PROT 7.5 10/23/2017   ALBUMIN 3.1 (L) 10/23/2017   AST 105 (H) 10/23/2017   ALT 57 (H) 10/23/2017   ALKPHOS 188 (H) 10/23/2017   BILITOT 1.1 10/23/2017   GFRNONAA >60 10/23/2017   GFRAA >60 10/23/2017     Medications: I have reviewed the patient's current medications.   Assessment/Plan: 1. Multifocal hepatocellular carcinoma  Abdominal ultrasound 08/08/2017-multiple mass lesions of varying echogenicity within the liver with the largest within the right lobe appearing to extend into the intrahepatic IVC. Mild ascites right upper  quadrant.  08/08/2017 AFP elevated225.9  MRI abdomen 08/11/2017-cirrhosis with multifocal hepatocellular carcinoma, dominant lesion right hepatic lobe 16.5 cm. Dominant lesion invades the IVC with tumor thrombus in the IVC extending up toward and potentially into the lower portion of the right atrium. Mild perihepatic ascites.  CTs4/16/2019-multifocal hepatocellular carcinoma. Right-sided tumor extension into the IVC and inferior right atrium. Moderate to large volume bilateral pulmonary emboli. Thoracic adenopathy suspicious for metastatic disease. Nonspecific bilateral pulmonary nodules. Similar small volume of abdominopelvic ascites.  Sorafenib started 08/31/2017  Sorafenib placed on hold 09/27/2017  Sorafenib discontinued 10/02/2017  Cycle 1 nivolumab 10/09/2017  Cycle 2 nivolumab 10/23/2017 2. Hepatitis C status post treatment with Epclusa 3. Exertional dyspnea probably secondary to COPD, question HCC involving the lungs, question pleural effusion 4. Abdominal pain secondary to #1 5. Bilateral pulmonary emboli on chest CT 08/20/2017. Lovenox initiated 08/21/2017.Lovenox placed on hold 09/27/2017 6. Fall 08/22/2017-hematoma right gluteus 7. Bleeding mass lesion at the right lower gumline 09/27/2017- large right lower gumline ulceration noted 10/02/2017, no remaining mass, no bleeding; status post extraction of 2 teeth 10/08/2017.  Ulceration improved 10/09/2017.   Disposition: Wayne Rose appears stable.  He tolerated the nivolumab well.  He will complete cycle 2 today.  He will try Phenergan as needed for nausea as it is possible the headaches are related to ondansetron.  He will return for an office visit and nivolumab in 2 weeks.  Betsy Coder, MD  10/23/2017  12:29 PM

## 2017-10-23 NOTE — Patient Instructions (Signed)
[facility] Discharge Instructions for Patients Receiving Chemotherapy  Today you received the following chemotherapy agents Opdivo  To help prevent nausea and vomiting after your treatment, we encourage you to take your nausea medication as directed   If you develop nausea and vomiting that is not controlled by your nausea medication, call the clinic.   BELOW ARE SYMPTOMS THAT SHOULD BE REPORTED IMMEDIATELY:  *FEVER GREATER THAN 100.5 F  *CHILLS WITH OR WITHOUT FEVER  NAUSEA AND VOMITING THAT IS NOT CONTROLLED WITH YOUR NAUSEA MEDICATION  *UNUSUAL SHORTNESS OF BREATH  *UNUSUAL BRUISING OR BLEEDING  TENDERNESS IN MOUTH AND THROAT WITH OR WITHOUT PRESENCE OF ULCERS  *URINARY PROBLEMS  *BOWEL PROBLEMS  UNUSUAL RASH Items with * indicate a potential emergency and should be followed up as soon as possible.  Feel free to call the clinic should you have any questions or concerns. The clinic phone number is [phone].  Please show the CHEMO ALERT CARD at check-in to the Emergency Department and triage nurse.   

## 2017-10-23 NOTE — Addendum Note (Signed)
Addended by: Mathis Fare on: 10/23/2017 04:36 PM   Modules accepted: Orders

## 2017-10-24 ENCOUNTER — Telehealth: Payer: Self-pay | Admitting: Medical

## 2017-10-24 NOTE — Telephone Encounter (Signed)
No 6/19 los °

## 2017-10-24 NOTE — Progress Notes (Signed)
   DATE: 10/23/2017     X CHEMO/IMMUNOTHERAPY REACTION           MD: Dr. Dominica Severin B. Sherrill    AGENT/BLOOD PRODUCT RECEIVING TODAY:   Opdivo  AGENT/BLOOD PRODUCT RECEIVING IMMEDIATELY PRIOR TO REACTION: Opdivo  VS: BP:     150/113 p:       112 SPO2:       93% on room air       T:       97.8  BP:     120/87  p:       99 SPO2:       93% on room air       T:       98.7  REACTION(S): Chills and Rigors  PREMEDS: No premeds  INTERVENTION:  Chills and rigors resolved spontaneously after several minutes without intervention.  Review of Systems  Constitutional: Positive for chills. Negative for diaphoresis.       Rigors  HENT: Negative for congestion, facial swelling and trouble swallowing.   Respiratory: Negative for cough, choking, chest tightness, shortness of breath and wheezing.   Cardiovascular: Negative for chest pain and palpitations.  Gastrointestinal: Negative for nausea and vomiting.  Musculoskeletal: Negative for back pain.  Skin: Negative for rash.  Neurological: Negative for dizziness, speech difficulty and headaches.  Psychiatric/Behavioral: The patient is not nervous/anxious.     Physical Exam  Constitutional: No distress.  Rigors  HENT:  Head: Normocephalic and atraumatic.  Cardiovascular: Normal rate, regular rhythm and normal heart sounds. Exam reveals no gallop and no friction rub.  No murmur heard. Pulmonary/Chest: Effort normal and breath sounds normal. No respiratory distress. He has no wheezes. He has no rales.  Neurological: He is alert.  Skin: Skin is warm and dry. No rash noted. He is not diaphoretic. No erythema.   OUTCOME: Chills and rigors resolved spontaneously after several minutes without intervention.  Plans are to add Benadryl 25 mg p.o. Pepcid 20 mg IV with future infusions.  Sandi Mealy, MHS, PA-C  This patient was seen with Dr. Benay Spice.

## 2017-11-03 ENCOUNTER — Other Ambulatory Visit: Payer: Self-pay | Admitting: Oncology

## 2017-11-06 ENCOUNTER — Encounter: Payer: Self-pay | Admitting: Nurse Practitioner

## 2017-11-06 ENCOUNTER — Inpatient Hospital Stay: Payer: Medicare Other | Admitting: Nurse Practitioner

## 2017-11-06 ENCOUNTER — Ambulatory Visit: Payer: Self-pay

## 2017-11-06 ENCOUNTER — Inpatient Hospital Stay: Payer: Medicare Other | Attending: Nurse Practitioner

## 2017-11-06 ENCOUNTER — Other Ambulatory Visit: Payer: Self-pay

## 2017-11-06 ENCOUNTER — Inpatient Hospital Stay: Payer: Medicare Other

## 2017-11-06 VITALS — BP 125/90 | HR 90 | Temp 98.0°F | Resp 18 | Ht 70.5 in | Wt 167.4 lb

## 2017-11-06 DIAGNOSIS — Z79899 Other long term (current) drug therapy: Secondary | ICD-10-CM | POA: Insufficient documentation

## 2017-11-06 DIAGNOSIS — C220 Liver cell carcinoma: Secondary | ICD-10-CM | POA: Insufficient documentation

## 2017-11-06 DIAGNOSIS — Z5112 Encounter for antineoplastic immunotherapy: Secondary | ICD-10-CM | POA: Diagnosis not present

## 2017-11-06 DIAGNOSIS — R194 Change in bowel habit: Secondary | ICD-10-CM | POA: Diagnosis not present

## 2017-11-06 DIAGNOSIS — R7989 Other specified abnormal findings of blood chemistry: Secondary | ICD-10-CM

## 2017-11-06 LAB — CMP (CANCER CENTER ONLY)
ALBUMIN: 2.9 g/dL — AB (ref 3.5–5.0)
ALT: 55 U/L — AB (ref 0–44)
AST: 110 U/L — AB (ref 15–41)
Alkaline Phosphatase: 243 U/L — ABNORMAL HIGH (ref 38–126)
Anion gap: 9 (ref 5–15)
BUN: 26 mg/dL — AB (ref 8–23)
CHLORIDE: 103 mmol/L (ref 98–111)
CO2: 27 mmol/L (ref 22–32)
CREATININE: 0.83 mg/dL (ref 0.61–1.24)
Calcium: 9.8 mg/dL (ref 8.9–10.3)
GFR, Est AFR Am: >60 mL/min (ref >60)
Glucose, Bld: 105 mg/dL — ABNORMAL HIGH (ref 70–99)
POTASSIUM: 4.1 mmol/L (ref 3.5–5.1)
Sodium: 139 mmol/L (ref 135–145)
Total Bilirubin: 1.4 mg/dL — ABNORMAL HIGH (ref 0.3–1.2)
Total Protein: 7.7 g/dL (ref 6.5–8.1)

## 2017-11-06 MED ORDER — FAMOTIDINE IN NACL 20-0.9 MG/50ML-% IV SOLN
INTRAVENOUS | Status: AC
  Filled 2017-11-06: qty 50

## 2017-11-06 MED ORDER — OXYCODONE-ACETAMINOPHEN 5-325 MG PO TABS: 1.0000 | ORAL_TABLET | Freq: Four times a day (QID) | ORAL | 0 refills | Status: DC | PRN

## 2017-11-06 MED ORDER — SODIUM CHLORIDE 0.9 % IV SOLN
Freq: Once | INTRAVENOUS | Status: AC
  Administered 2017-11-06: 16:00:00 via INTRAVENOUS

## 2017-11-06 MED ORDER — DIPHENHYDRAMINE HCL 25 MG PO CAPS
ORAL_CAPSULE | ORAL | Status: AC
  Filled 2017-11-06: qty 1

## 2017-11-06 MED ORDER — DIPHENHYDRAMINE HCL 25 MG PO TABS
25.0000 mg | ORAL_TABLET | Freq: Once | ORAL | Status: AC
  Administered 2017-11-06: 25 mg via ORAL
  Filled 2017-11-06: qty 1

## 2017-11-06 MED ORDER — SODIUM CHLORIDE 0.9 % IV SOLN
240.0000 mg | Freq: Once | INTRAVENOUS | Status: AC
  Administered 2017-11-06: 240 mg via INTRAVENOUS
  Filled 2017-11-06: qty 24

## 2017-11-06 MED ORDER — FAMOTIDINE IN NACL 20-0.9 MG/50ML-% IV SOLN
20.0000 mg | Freq: Once | INTRAVENOUS | Status: AC
  Administered 2017-11-06: 20 mg via INTRAVENOUS

## 2017-11-06 MED FILL — ENOXAPARIN 100 MG/ML SYR: 100 | 30 days supply | Qty: 30 | Fill #1

## 2017-11-06 NOTE — Progress Notes (Unsigned)
Okay to treat with AST of 110 and no CBC needed for today's  Treatment per Santiago Glad per Leander Rams. NP.

## 2017-11-06 NOTE — Patient Instructions (Addendum)
[facility] Discharge Instructions for Patients Receiving Chemotherapy  Today you received the following chemotherapy agents: Nivolumab  To help prevent nausea and vomiting after your treatment, we encourage you to take your nausea medication as directed.    If you develop nausea and vomiting that is not controlled by your nausea medication, call the clinic.   BELOW ARE SYMPTOMS THAT SHOULD BE REPORTED IMMEDIATELY:  *FEVER GREATER THAN 100.5 F  *CHILLS WITH OR WITHOUT FEVER  NAUSEA AND VOMITING THAT IS NOT CONTROLLED WITH YOUR NAUSEA MEDICATION  *UNUSUAL SHORTNESS OF BREATH  *UNUSUAL BRUISING OR BLEEDING  TENDERNESS IN MOUTH AND THROAT WITH OR WITHOUT PRESENCE OF ULCERS  *URINARY PROBLEMS  *BOWEL PROBLEMS  UNUSUAL RASH Items with * indicate a potential emergency and should be followed up as soon as possible.  Feel free to call the clinic should you have any questions or concerns. The clinic phone number is [phone].  Please show the CHEMO ALERT CARD at check-in to the Emergency Department and triage nurse.   

## 2017-11-06 NOTE — Progress Notes (Signed)
  [place] OFFICE PROGRESS NOTE   Diagnosis: Hepatocellular carcinoma  INTERVAL HISTORY:   Wayne Rose returns as scheduled.  He completed cycle 2 nivolumab 10/23/2017.  He reports developing "shakes" about 20 minutes after completing the infusion.  He was seen by Wayne Mealy, PA.  The chills and rigors resolved spontaneously after several minutes without intervention.  He has had increased nausea for the past week.  Headaches overall are better.  He has had loose stools for the last 3 days.  He estimates 2 to 3/day.  No rash.  He continues to have abdominal pain.  He takes Percocet as needed.  Objective:  Vital signs in last 24 hours:  Blood pressure 125/90, pulse 90, temperature 98 F (36.7 C), temperature source Oral, resp. rate 18, height 5' 10.5" (1.791 m), weight 167 lb 6.4 oz (75.9 kg), SpO2 98 %.    HEENT: No thrush or ulcers. Resp: Distant breath sounds.  No respiratory distress. Cardio: Regular rate and rhythm. GI: Abdomen is soft.  Fullness right upper abdomen.  No definite hepatomegaly. Vascular: No leg edema. Skin: No rash.   Lab Results:  Lab Results  Component Value Date   WBC 7.4 10/23/2017   HGB 13.8 10/23/2017   HCT 42.1 10/23/2017   MCV 80.9 10/23/2017   PLT 292 10/23/2017   NEUTROABS 5.5 10/23/2017    Imaging:  No results found.  Medications: I have reviewed the patient's current medications.  Assessment/Plan: 1. Multifocal hepatocellular carcinoma  Abdominal ultrasound 08/08/2017-multiple mass lesions of varying echogenicity within the liver with the largest within the right lobe appearing to extend into the intrahepatic IVC. Mild ascites right upper quadrant.  08/08/2017 AFP elevated225.9  MRI abdomen 08/11/2017-cirrhosis with multifocal hepatocellular carcinoma, dominant lesion right hepatic lobe 16.5 cm. Dominant lesion invades the IVC with tumor thrombus in the IVC extending up toward and potentially into the lower portion  of the right atrium. Mild perihepatic ascites.  CTs4/16/2019-multifocal hepatocellular carcinoma. Right-sided tumor extension into the IVC and inferior right atrium. Moderate to large volume bilateral pulmonary emboli. Thoracic adenopathy suspicious for metastatic disease. Nonspecific bilateral pulmonary nodules. Similar small volume of abdominopelvic ascites.  Sorafenib started 08/31/2017  Sorafenib placed on hold 09/27/2017  Sorafenib discontinued 10/02/2017  Cycle 1 nivolumab 10/09/2017  Cycle 2 nivolumab 10/23/2017  Cycle 3 nivolumab 11/06/2017 2. Hepatitis C status post treatment with Epclusa 3. Exertional dyspnea probably secondary to COPD, question HCC involving the lungs, question pleural effusion 4. Abdominal pain secondary to #1 5. Bilateral pulmonary emboli on chest CT 08/20/2017. Lovenox initiated 08/21/2017.Lovenox placed on hold 09/27/2017 6. Fall 08/22/2017-hematoma right gluteus 7. Bleeding mass lesion at the right lower gumline 09/27/2017- large right lower gumline ulceration noted 10/02/2017, no remaining mass, no bleeding;status post extraction of 2 teeth 10/08/2017. Ulceration improved 10/09/2017.   Disposition: Wayne Rose appears stable.  He has completed 2 cycles of nivolumab.  Plan to proceed with cycle 3 today as scheduled.  We reviewed the chemistry panel from today.  Overall stable elevation of LFTs. Continue to monitor.   For the loose stools he will try Imodium.  He understands to contact the office if this is not effective.  He will return for lab, follow-up and cycle 4 nivolumab in 2 weeks.  He will contact the office in the interim with any problems.  Plan reviewed with Wayne Rose.    Wayne Rose ANP/GNP-BC   11/06/2017  2:07 PM

## 2017-11-17 ENCOUNTER — Other Ambulatory Visit: Payer: Self-pay | Admitting: Oncology

## 2017-11-20 ENCOUNTER — Other Ambulatory Visit: Payer: Self-pay

## 2017-11-20 ENCOUNTER — Ambulatory Visit: Payer: Self-pay

## 2017-11-20 NOTE — Progress Notes (Signed)
[facility]  Telephone:([phone]) [phone] Fax:([phone]) [phone]  Clinic Follow up Note   Patient Care Team: Lawerance Cruel, MD as PCP - General (Family Medicine) 11/21/2017   DIAGNOSIS: hepatocellular carcinoma    INTERVAL HISTORY: Mr. Lavell returns for follow up as scheduled. He completed cycle 3 Nivolumab on 11/06/17. He tolerated the infusion without difficulty. He remains weak and fatigued at times, has to push himself to complete ADLs. He has intermittent nausea, resolves with phenergan which he takes q6 hours daily. Headaches resolved after stopping zofran. Denies emesis or constipation. Has infrequent diarrhea, resolves with imodium. Notes abdominal fullness after eating especially with larger meals. Denies bleeding of any kind. Low-middle abdominal pain is stable, rates 6 or 7/10 when increased; controlled with percocet BID. He develops RUQ and referred right chest pleuritic pain after coughing and intermittently with deep inspiration. Denies chest pain on exertion or dyspnea. Cough is nonproductive. Denies fever or chills. 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   REVIEW OF SYSTEMS:   Constitutional: Denies fevers, chills or abnormal weight loss (+) intermittent fatigue  Ears, nose, mouth, throat, and face: Denies mucositis  Respiratory: Denies dyspnea or wheezes (+) intermittent nonproductive cough  Cardiovascular: Denies palpitation or lower extremity swelling (+) pleuritic chest pain after coughing or deep inspiration  Gastrointestinal:  Denies nausea, heartburn or change in bowel habits (+) intermittent nausea (+) periodic diarrhea (+) RUQ pain after coughing (+) low mid abdominal pain  Skin: Denies abnormal skin rashes Lymphatics: Denies new lymphadenopathy or easy bruising Neurological:Denies new numbness, tingling (+) intermittent weakness on exertion (+) pre-existing neuropathy to feet, stable  Behavioral/Psych: Mood is stable, no new changes  All other  systems were reviewed with the patient and are negative.  MEDICAL HISTORY:  Past Medical History:  Diagnosis Date  . Hepatitis C    Treatment 2017- Cleared per patient    SURGICAL HISTORY: Past Surgical History:  Procedure Laterality Date  . SHOULDER ARTHROSCOPY WITH SUBACROMIAL DECOMPRESSION Left 2014    I have reviewed the social history and family history with the patient and they are unchanged from previous note.  ALLERGIES:  has No Known Allergies.  MEDICATIONS:  Current Outpatient Medications  Medication Sig Dispense Refill  . enoxaparin (LOVENOX) 100 MG/ML injection Inject 1 mL (100 mg total) into the skin daily. 30 Syringe 3  . oxyCODONE-acetaminophen (PERCOCET/ROXICET) 5-325 MG tablet Take 1 tablet by mouth every 6 (six) hours as needed for severe pain. 60 tablet 0  . promethazine (PHENERGAN) 12.5 MG tablet Take 1 tablet (12.5 mg total) by mouth every 6 (six) hours as needed for nausea or vomiting. 30 tablet 2   No current facility-administered medications for this visit.     PHYSICAL EXAMINATION: ECOG PERFORMANCE STATUS: 1 - Symptomatic but completely ambulatory  Vitals:   11/21/17 0832  BP: (!) 119/94  Pulse: (!) 107  Resp: 20  Temp: 97.8 F (36.6 C)  SpO2: 95%   Filed Weights   11/21/17 0832  Weight: 165 lb 6.4 oz (75 kg)    GENERAL:alert, no distress and comfortable SKIN: no rashes or significant lesions EYES: sclera anicteric  OROPHARYNX:no thrush or ulcers  LYMPH:  no palpable cervical or supraclavicular lymphadenopathy  LUNGS: clear to auscultation with normal breathing effort HEART: regular rate & rhythm, no lower extremity edema ABDOMEN:abdomen soft with normal bowel sounds, mild tenderness to RUQ without hepatomegaly  Musculoskeletal:no cyanosis of digits and no clubbing  NEURO: alert & oriented x 3 with fluent speech  LABORATORY  DATA:  I have reviewed the data as listed CBC Latest Ref Rng & Units 11/21/2017 10/23/2017 09/27/2017  WBC 4.0 -  10.3 K/uL 6.7 7.4 6.7  Hemoglobin 13.0 - 17.1 g/dL 14.3 13.8 15.1  Hematocrit 38.4 - 49.9 % 43.5 42.1 46.2  Platelets 140 - 400 K/uL 302 292 251     CMP Latest Ref Rng & Units 11/21/2017 11/06/2017 10/23/2017  Glucose 70 - 99 mg/dL 150(H) 105(H) 100  BUN 8 - 23 mg/dL 18 26(H) 22  Creatinine 0.61 - 1.24 mg/dL 0.84 0.83 0.88  Sodium 135 - 145 mmol/L 138 139 137  Potassium 3.5 - 5.1 mmol/L 3.7 4.1 3.8  Chloride 98 - 111 mmol/L 104 103 103  CO2 22 - 32 mmol/L 23 27 25   Calcium 8.9 - 10.3 mg/dL 9.6 9.8 9.5  Total Protein 6.5 - 8.1 g/dL 7.8 7.7 7.5  Total Bilirubin 0.3 - 1.2 mg/dL 1.0 1.4(H) 1.1  Alkaline Phos 38 - 126 U/L 271(H) 243(H) 188(H)  AST 15 - 41 U/L 116(H) 110(H) 105(H)  ALT 0 - 44 U/L 57(H) 55(H) 57(H)      RADIOGRAPHIC STUDIES: I have personally reviewed the radiological images as listed and agreed with the findings in the report. No results found.   ASSESSMENT & PLAN:   1. Multifocal hepatocellular carcinoma  Abdominal ultrasound 08/08/2017-multiple mass lesions of varying echogenicity within the liver with the largest within the right lobe appearing to extend into the intrahepatic IVC. Mild ascites right upper quadrant.  08/08/2017 AFP elevated225.9  MRI abdomen 08/11/2017-cirrhosis with multifocal hepatocellular carcinoma, dominant lesion right hepatic lobe 16.5 cm. Dominant lesion invades the IVC with tumor thrombus in the IVC extending up toward and potentially into the lower portion of the right atrium. Mild perihepatic ascites.  CTs4/16/2019-multifocal hepatocellular carcinoma. Right-sided tumor extension into the IVC and inferior right atrium. Moderate to large volume bilateral pulmonary emboli. Thoracic adenopathy suspicious for metastatic disease. Nonspecific bilateral pulmonary nodules. Similar small volume of abdominopelvic ascites.  Sorafenib started 08/31/2017  Sorafenib placed on hold 09/27/2017  Sorafenib discontinued 10/02/2017  Cycle 1  nivolumab 10/09/2017  Cycle 2 nivolumab 10/23/2017  Cycle 3 nivolumab 11/06/2017  Cycle 4 nivolumab 11/21/2017 2. Hepatitis C status post treatment with Epclusa 3. Exertional dyspnea probably secondary to COPD, question HCC involving the lungs, question pleural effusion 4. Abdominal pain secondary to #1 5. Bilateral pulmonary emboli on chest CT 08/20/2017. Lovenox initiated 08/21/2017.Lovenox placed on hold 09/27/2017 6. Fall 08/22/2017-hematoma right gluteus 7. Bleeding mass lesion at the right lower gumline 09/27/2017- large right lower gumline ulceration noted 10/02/2017, no remaining mass, no bleeding;status post extraction of 2 teeth 10/08/2017. Ulceration improved 10/09/2017.  Disposition: [name] appears stable. He completed cycle 3 nivolumab. He is tolerating treatment moderately well overall, with frequent nausea, which appears well controlled with phenergan PRN. Abdominal pain is stable, managed with percocet which he takes BID. He will continue current symptom management. Labs reviewed, CBC and CMP adequate for treatment. LFTs are stable, bili is normal. TSH is normal. AFP is pending. I recommend to proceed with cycle 4 nivolumab today. He will return in 2 weeks for f/u and cycle 5. The plan was reviewed with Dr. Benay Spice.   All questions were answered. The patient knows to call the clinic with any problems, questions or concerns. No barriers to learning was detected.    Alla Feeling, NP 11/21/17

## 2017-11-21 ENCOUNTER — Encounter: Payer: Self-pay | Admitting: Nurse Practitioner

## 2017-11-21 ENCOUNTER — Inpatient Hospital Stay: Payer: Medicare Other

## 2017-11-21 ENCOUNTER — Telehealth: Payer: Self-pay | Admitting: Nurse Practitioner

## 2017-11-21 ENCOUNTER — Inpatient Hospital Stay (HOSPITAL_BASED_OUTPATIENT_CLINIC_OR_DEPARTMENT_OTHER): Payer: Medicare Other

## 2017-11-21 ENCOUNTER — Inpatient Hospital Stay: Payer: Medicare Other | Admitting: Nurse Practitioner

## 2017-11-21 VITALS — BP 119/94 | HR 107 | Temp 97.8°F | Resp 20 | Ht 70.5 in | Wt 165.4 lb

## 2017-11-21 VITALS — HR 95

## 2017-11-21 DIAGNOSIS — I2699 Other pulmonary embolism without acute cor pulmonale: Secondary | ICD-10-CM

## 2017-11-21 DIAGNOSIS — G893 Neoplasm related pain (acute) (chronic): Secondary | ICD-10-CM

## 2017-11-21 DIAGNOSIS — G629 Polyneuropathy, unspecified: Secondary | ICD-10-CM | POA: Diagnosis not present

## 2017-11-21 DIAGNOSIS — R197 Diarrhea, unspecified: Secondary | ICD-10-CM

## 2017-11-21 DIAGNOSIS — R5383 Other fatigue: Secondary | ICD-10-CM

## 2017-11-21 DIAGNOSIS — R531 Weakness: Secondary | ICD-10-CM

## 2017-11-21 DIAGNOSIS — C220 Liver cell carcinoma: Secondary | ICD-10-CM | POA: Diagnosis not present

## 2017-11-21 DIAGNOSIS — Z5112 Encounter for antineoplastic immunotherapy: Secondary | ICD-10-CM | POA: Diagnosis not present

## 2017-11-21 DIAGNOSIS — R110 Nausea: Secondary | ICD-10-CM

## 2017-11-21 LAB — CBC WITH DIFFERENTIAL (CANCER CENTER ONLY)
Basophils Absolute: 0.1 10*3/uL (ref 0.0–0.1)
Basophils Relative: 1 %
EOS PCT: 2 %
Eosinophils Absolute: 0.1 10*3/uL (ref 0.0–0.5)
HEMATOCRIT: 43.5 % (ref 38.4–49.9)
HEMOGLOBIN: 14.3 g/dL (ref 13.0–17.1)
LYMPHS ABS: 0.9 10*3/uL (ref 0.9–3.3)
LYMPHS PCT: 14 %
MCH: 26.5 pg — AB (ref 27.2–33.4)
MCHC: 32.8 g/dL (ref 32.0–36.0)
MCV: 80.9 fL (ref 79.3–98.0)
Monocytes Absolute: 0.7 10*3/uL (ref 0.1–0.9)
Monocytes Relative: 10 %
NEUTROS ABS: 4.8 10*3/uL (ref 1.5–6.5)
Neutrophils Relative %: 73 %
Platelet Count: 302 10*3/uL (ref 140–400)
RBC: 5.38 MIL/uL (ref 4.20–5.82)
RDW: 18.6 % — ABNORMAL HIGH (ref 11.0–14.6)
WBC Count: 6.7 10*3/uL (ref 4.0–10.3)

## 2017-11-21 LAB — CMP (CANCER CENTER ONLY)
ALBUMIN: 2.8 g/dL — AB (ref 3.5–5.0)
ALT: 57 U/L — ABNORMAL HIGH (ref 0–44)
ANION GAP: 11 (ref 5–15)
AST: 116 U/L — AB (ref 15–41)
Alkaline Phosphatase: 271 U/L — ABNORMAL HIGH (ref 38–126)
BILIRUBIN TOTAL: 1 mg/dL (ref 0.3–1.2)
BUN: 18 mg/dL (ref 8–23)
CHLORIDE: 104 mmol/L (ref 98–111)
CO2: 23 mmol/L (ref 22–32)
Calcium: 9.6 mg/dL (ref 8.9–10.3)
Creatinine: 0.84 mg/dL (ref 0.61–1.24)
GFR, Est AFR Am: >60 mL/min (ref >60)
Glucose, Bld: 150 mg/dL — ABNORMAL HIGH (ref 70–99)
POTASSIUM: 3.7 mmol/L (ref 3.5–5.1)
Sodium: 138 mmol/L (ref 135–145)
TOTAL PROTEIN: 7.8 g/dL (ref 6.5–8.1)

## 2017-11-21 LAB — TSH: TSH: 3.969 u[IU]/mL (ref 0.320–4.118)

## 2017-11-21 MED ORDER — SODIUM CHLORIDE 0.9 % IV SOLN
Freq: Once | INTRAVENOUS | Status: AC
  Administered 2017-11-21: 10:00:00 via INTRAVENOUS

## 2017-11-21 MED ORDER — DIPHENHYDRAMINE HCL 25 MG PO TABS
25.0000 mg | ORAL_TABLET | Freq: Once | ORAL | Status: AC
  Administered 2017-11-21: 25 mg via ORAL
  Filled 2017-11-21: qty 1

## 2017-11-21 MED ORDER — SODIUM CHLORIDE 0.9 % IV SOLN
240.0000 mg | Freq: Once | INTRAVENOUS | Status: AC
  Administered 2017-11-21: 240 mg via INTRAVENOUS
  Filled 2017-11-21: qty 24

## 2017-11-21 MED ORDER — FAMOTIDINE IN NACL 20-0.9 MG/50ML-% IV SOLN
20.0000 mg | Freq: Once | INTRAVENOUS | Status: AC
  Administered 2017-11-21: 20 mg via INTRAVENOUS

## 2017-11-21 NOTE — Telephone Encounter (Signed)
Patient scheduled on gbs call day ok per nurse. Gave patient avs and calendar of upcoming July and aug appts.

## 2017-11-21 NOTE — Patient Instructions (Signed)
[facility] Discharge Instructions for Patients Receiving Chemotherapy  Today you received the following chemotherapy agents: Nivolumab  To help prevent nausea and vomiting after your treatment, we encourage you to take your nausea medication as directed.    If you develop nausea and vomiting that is not controlled by your nausea medication, call the clinic.   BELOW ARE SYMPTOMS THAT SHOULD BE REPORTED IMMEDIATELY:  *FEVER GREATER THAN 100.5 F  *CHILLS WITH OR WITHOUT FEVER  NAUSEA AND VOMITING THAT IS NOT CONTROLLED WITH YOUR NAUSEA MEDICATION  *UNUSUAL SHORTNESS OF BREATH  *UNUSUAL BRUISING OR BLEEDING  TENDERNESS IN MOUTH AND THROAT WITH OR WITHOUT PRESENCE OF ULCERS  *URINARY PROBLEMS  *BOWEL PROBLEMS  UNUSUAL RASH Items with * indicate a potential emergency and should be followed up as soon as possible.  Feel free to call the clinic should you have any questions or concerns. The clinic phone number is [phone].  Please show the CHEMO ALERT CARD at check-in to the Emergency Department and triage nurse.   

## 2017-11-22 LAB — AFP TUMOR MARKER: AFP, SERUM, TUMOR MARKER: 378.2 ng/mL — AB (ref 0.0–8.3)

## 2017-12-01 ENCOUNTER — Other Ambulatory Visit: Payer: Self-pay | Admitting: Oncology

## 2017-12-03 ENCOUNTER — Other Ambulatory Visit: Payer: Self-pay

## 2017-12-03 DIAGNOSIS — C220 Liver cell carcinoma: Secondary | ICD-10-CM

## 2017-12-03 MED ORDER — OXYCODONE-ACETAMINOPHEN 5-325 MG PO TABS: 1.0000 | ORAL_TABLET | Freq: Four times a day (QID) | ORAL | 0 refills | Status: DC | PRN

## 2017-12-03 MED ORDER — PROMETHAZINE HCL 12.5 MG PO TABS: 12.5000 mg | ORAL_TABLET | Freq: Four times a day (QID) | ORAL | 2 refills | Status: AC | PRN

## 2017-12-03 NOTE — Telephone Encounter (Signed)
Called to inform pt that phenergan and percocet have been refilled. Percocet prescription paper

## 2017-12-04 ENCOUNTER — Ambulatory Visit: Payer: Self-pay

## 2017-12-04 ENCOUNTER — Inpatient Hospital Stay (HOSPITAL_BASED_OUTPATIENT_CLINIC_OR_DEPARTMENT_OTHER): Payer: Medicare Other | Admitting: Nurse Practitioner

## 2017-12-04 ENCOUNTER — Inpatient Hospital Stay: Payer: Medicare Other

## 2017-12-04 ENCOUNTER — Other Ambulatory Visit: Payer: Self-pay

## 2017-12-04 ENCOUNTER — Other Ambulatory Visit: Payer: Self-pay | Admitting: Emergency Medicine

## 2017-12-04 ENCOUNTER — Encounter: Payer: Self-pay | Admitting: Nurse Practitioner

## 2017-12-04 VITALS — BP 111/79 | HR 106 | Temp 98.2°F | Resp 17 | Ht 70.5 in | Wt 164.7 lb

## 2017-12-04 DIAGNOSIS — C220 Liver cell carcinoma: Secondary | ICD-10-CM | POA: Diagnosis not present

## 2017-12-04 DIAGNOSIS — R197 Diarrhea, unspecified: Secondary | ICD-10-CM

## 2017-12-04 DIAGNOSIS — Z5112 Encounter for antineoplastic immunotherapy: Secondary | ICD-10-CM | POA: Diagnosis not present

## 2017-12-04 DIAGNOSIS — I2699 Other pulmonary embolism without acute cor pulmonale: Secondary | ICD-10-CM | POA: Diagnosis not present

## 2017-12-04 DIAGNOSIS — G893 Neoplasm related pain (acute) (chronic): Secondary | ICD-10-CM

## 2017-12-04 DIAGNOSIS — R110 Nausea: Secondary | ICD-10-CM

## 2017-12-04 DIAGNOSIS — R51 Headache: Secondary | ICD-10-CM

## 2017-12-04 LAB — CBC WITH DIFFERENTIAL (CANCER CENTER ONLY)
BASOS ABS: 0.1 10*3/uL (ref 0.0–0.1)
BASOS PCT: 1 %
Eosinophils Absolute: 0.1 10*3/uL (ref 0.0–0.5)
Eosinophils Relative: 1 %
HEMATOCRIT: 42.9 % (ref 38.4–49.9)
HEMOGLOBIN: 14 g/dL (ref 13.0–17.1)
Lymphocytes Relative: 11 %
Lymphs Abs: 0.9 10*3/uL (ref 0.9–3.3)
MCH: 26.1 pg — ABNORMAL LOW (ref 27.2–33.4)
MCHC: 32.7 g/dL (ref 32.0–36.0)
MCV: 79.8 fL (ref 79.3–98.0)
Monocytes Absolute: 1.1 10*3/uL — ABNORMAL HIGH (ref 0.1–0.9)
Monocytes Relative: 14 %
NEUTROS ABS: 5.7 10*3/uL (ref 1.5–6.5)
NEUTROS PCT: 73 %
Platelet Count: 283 10*3/uL (ref 140–400)
RBC: 5.37 MIL/uL (ref 4.20–5.82)
RDW: 17.7 % — ABNORMAL HIGH (ref 11.0–14.6)
WBC Count: 7.8 10*3/uL (ref 4.0–10.3)

## 2017-12-04 LAB — CMP (CANCER CENTER ONLY)
ALT: 58 U/L — ABNORMAL HIGH (ref 0–44)
ANION GAP: 11 (ref 5–15)
AST: 152 U/L — ABNORMAL HIGH (ref 15–41)
Albumin: 2.7 g/dL — ABNORMAL LOW (ref 3.5–5.0)
Alkaline Phosphatase: 246 U/L — ABNORMAL HIGH (ref 38–126)
BUN: 24 mg/dL — ABNORMAL HIGH (ref 8–23)
CO2: 23 mmol/L (ref 22–32)
Calcium: 9.4 mg/dL (ref 8.9–10.3)
Chloride: 103 mmol/L (ref 98–111)
Creatinine: 0.91 mg/dL (ref 0.61–1.24)
GFR, Estimated: >60 mL/min (ref >60)
Glucose, Bld: 123 mg/dL — ABNORMAL HIGH (ref 70–99)
Potassium: 4 mmol/L (ref 3.5–5.1)
SODIUM: 137 mmol/L (ref 135–145)
Total Bilirubin: 1.1 mg/dL (ref 0.3–1.2)
Total Protein: 7.6 g/dL (ref 6.5–8.1)

## 2017-12-04 LAB — TSH: TSH: 4.185 u[IU]/mL — AB (ref 0.320–4.118)

## 2017-12-04 MED ORDER — FAMOTIDINE IN NACL 20-0.9 MG/50ML-% IV SOLN
20.0000 mg | Freq: Once | INTRAVENOUS | Status: AC
  Administered 2017-12-04: 20 mg via INTRAVENOUS

## 2017-12-04 MED ORDER — SODIUM CHLORIDE 0.9 % IV SOLN
240.0000 mg | Freq: Once | INTRAVENOUS | Status: AC
  Administered 2017-12-04: 240 mg via INTRAVENOUS
  Filled 2017-12-04: qty 24

## 2017-12-04 MED ORDER — DIPHENHYDRAMINE HCL 25 MG PO TABS
25.0000 mg | ORAL_TABLET | Freq: Once | ORAL | Status: AC
  Administered 2017-12-04: 25 mg via ORAL
  Filled 2017-12-04: qty 1

## 2017-12-04 MED ORDER — SODIUM CHLORIDE 0.9 % IV SOLN
Freq: Once | INTRAVENOUS | Status: AC
  Administered 2017-12-04: 15:00:00 via INTRAVENOUS
  Filled 2017-12-04: qty 250

## 2017-12-04 MED ORDER — DIPHENHYDRAMINE HCL 25 MG PO CAPS
ORAL_CAPSULE | ORAL | Status: AC
  Filled 2017-12-04: qty 1

## 2017-12-04 MED ORDER — FAMOTIDINE IN NACL 20-0.9 MG/50ML-% IV SOLN
INTRAVENOUS | Status: AC
  Filled 2017-12-04: qty 50

## 2017-12-04 MED FILL — ENOXAPARIN 100 MG/ML SYR: 100 | 30 days supply | Qty: 30 | Fill #2

## 2017-12-04 NOTE — Progress Notes (Signed)
Ok to tx w/ elevated LFT's per Ned Card, NP.

## 2017-12-04 NOTE — Progress Notes (Addendum)
  [place] OFFICE PROGRESS NOTE   Diagnosis: Hepatocellular carcinoma  INTERVAL HISTORY:   Mr. Theisen returns as scheduled.  He completed cycle 4 nivolumab 11/21/2017.  He continues to have intermittent nausea and diarrhea.  He estimates the diarrhea occurs 1-2 times a day.  He takes Imodium as needed.  He continues to have pain at the low abdomen.  He is having periodic low-grade fevers.  No rash.  He continues Lovenox.  No bleeding.  Objective:  Vital signs in last 24 hours:  Blood pressure 111/79, pulse (!) 106, temperature 98.2 F (36.8 C), temperature source Oral, resp. rate 17, height 5' 10.5" (1.791 m), weight 164 lb 11.2 oz (74.7 kg), SpO2 92 %.    HEENT: No thrush or ulcers. Resp: Lungs clear bilaterally. Cardio: Regular rate and rhythm. GI: Fullness right upper abdomen. Vascular: No leg edema. Skin: No rash.  A few small ecchymoses scattered over the anterior abdominal wall.   Lab Results:  Lab Results  Component Value Date   WBC 7.8 12/04/2017   HGB 14.0 12/04/2017   HCT 42.9 12/04/2017   MCV 79.8 12/04/2017   PLT 283 12/04/2017   NEUTROABS 5.7 12/04/2017    Imaging:  No results found.  Medications: I have reviewed the patient's current medications.  Assessment/Plan: 1. Multifocal hepatocellular carcinoma  Abdominal ultrasound 08/08/2017-multiple mass lesions of varying echogenicity within the liver with the largest within the right lobe appearing to extend into the intrahepatic IVC. Mild ascites right upper quadrant.  08/08/2017 AFP elevated225.9  MRI abdomen 08/11/2017-cirrhosis with multifocal hepatocellular carcinoma, dominant lesion right hepatic lobe 16.5 cm. Dominant lesion invades the IVC with tumor thrombus in the IVC extending up toward and potentially into the lower portion of the right atrium. Mild perihepatic ascites.  CTs4/16/2019-multifocal hepatocellular carcinoma. Right-sided tumor extension into the IVC and inferior  right atrium. Moderate to large volume bilateral pulmonary emboli. Thoracic adenopathy suspicious for metastatic disease. Nonspecific bilateral pulmonary nodules. Similar small volume of abdominopelvic ascites.  Sorafenib started 08/31/2017  Sorafenib placed on hold 09/27/2017  Sorafenib discontinued 10/02/2017  Cycle 1 nivolumab 10/09/2017  Cycle 2 nivolumab 10/23/2017  Cycle 3 nivolumab 11/06/2017  Cycle 4 nivolumab 11/21/2017  Cycle 5 nivolumab 12/04/2017 2. Hepatitis C status post treatment with Epclusa 3. Exertional dyspnea probably secondary to COPD, question HCC involving the lungs, question pleural effusion 4. Abdominal pain secondary to #1 5. Bilateral pulmonary emboli on chest CT 08/20/2017. Lovenox initiated 08/21/2017.Lovenox placed on hold 09/27/2017 6. Fall 08/22/2017-hematoma right gluteus 7. Bleeding mass lesion at the right lower gumline 09/27/2017- large right lower gumline ulceration noted 10/02/2017, no remaining mass, no bleeding;status post extraction of 2 teeth 10/08/2017. Ulceration improved 10/09/2017.     Disposition: Mr. Karner appears unchanged.  He has completed 4 cycles of nivolumab.  Plan to proceed with cycle 5 today as scheduled.  He will be referred for restaging CTs after he has completed 6 cycles of nivolumab.  We reviewed today's labs.  LFTs overall stable.  Continue to monitor.  He will return for lab, follow-up and cycle 6 nivolumab in 2 weeks.  He will contact the office in the interim with any problems.  Patient seen with Dr. Benay Spice.    Ned Card ANP/GNP-BC   12/04/2017  2:04 PM  This was a shared visit with Ned Card.  Mr. Barnett appears stable.  He will continue nivolumab.  Julieanne Manson, MD

## 2017-12-04 NOTE — Patient Instructions (Signed)
[facility] Discharge Instructions for Patients Receiving Chemotherapy  Today you received the following chemotherapy agents: Nivolumab  To help prevent nausea and vomiting after your treatment, we encourage you to take your nausea medication as directed.    If you develop nausea and vomiting that is not controlled by your nausea medication, call the clinic.   BELOW ARE SYMPTOMS THAT SHOULD BE REPORTED IMMEDIATELY:  *FEVER GREATER THAN 100.5 F  *CHILLS WITH OR WITHOUT FEVER  NAUSEA AND VOMITING THAT IS NOT CONTROLLED WITH YOUR NAUSEA MEDICATION  *UNUSUAL SHORTNESS OF BREATH  *UNUSUAL BRUISING OR BLEEDING  TENDERNESS IN MOUTH AND THROAT WITH OR WITHOUT PRESENCE OF ULCERS  *URINARY PROBLEMS  *BOWEL PROBLEMS  UNUSUAL RASH Items with * indicate a potential emergency and should be followed up as soon as possible.  Feel free to call the clinic should you have any questions or concerns. The clinic phone number is [phone].  Please show the CHEMO ALERT CARD at check-in to the Emergency Department and triage nurse.   

## 2017-12-15 ENCOUNTER — Other Ambulatory Visit: Payer: Self-pay | Admitting: Oncology

## 2017-12-18 ENCOUNTER — Other Ambulatory Visit: Payer: Self-pay

## 2017-12-18 ENCOUNTER — Encounter: Payer: Self-pay | Admitting: Nurse Practitioner

## 2017-12-18 ENCOUNTER — Inpatient Hospital Stay: Payer: Medicare Other | Admitting: Nurse Practitioner

## 2017-12-18 ENCOUNTER — Inpatient Hospital Stay: Payer: Medicare Other | Attending: Nurse Practitioner

## 2017-12-18 ENCOUNTER — Ambulatory Visit: Payer: Self-pay

## 2017-12-18 ENCOUNTER — Inpatient Hospital Stay: Payer: Medicare Other

## 2017-12-18 VITALS — BP 104/73 | HR 100 | Temp 98.5°F | Resp 18 | Ht 70.5 in | Wt 163.7 lb

## 2017-12-18 VITALS — HR 79

## 2017-12-18 DIAGNOSIS — G893 Neoplasm related pain (acute) (chronic): Secondary | ICD-10-CM | POA: Diagnosis not present

## 2017-12-18 DIAGNOSIS — R0609 Other forms of dyspnea: Secondary | ICD-10-CM

## 2017-12-18 DIAGNOSIS — C220 Liver cell carcinoma: Secondary | ICD-10-CM

## 2017-12-18 DIAGNOSIS — R197 Diarrhea, unspecified: Secondary | ICD-10-CM | POA: Insufficient documentation

## 2017-12-18 DIAGNOSIS — I2699 Other pulmonary embolism without acute cor pulmonale: Secondary | ICD-10-CM | POA: Insufficient documentation

## 2017-12-18 DIAGNOSIS — R0600 Dyspnea, unspecified: Secondary | ICD-10-CM | POA: Diagnosis not present

## 2017-12-18 DIAGNOSIS — Z7901 Long term (current) use of anticoagulants: Secondary | ICD-10-CM | POA: Diagnosis not present

## 2017-12-18 DIAGNOSIS — R110 Nausea: Secondary | ICD-10-CM | POA: Diagnosis not present

## 2017-12-18 DIAGNOSIS — R05 Cough: Secondary | ICD-10-CM

## 2017-12-18 DIAGNOSIS — Z5112 Encounter for antineoplastic immunotherapy: Secondary | ICD-10-CM | POA: Diagnosis not present

## 2017-12-18 DIAGNOSIS — Z79899 Other long term (current) drug therapy: Secondary | ICD-10-CM | POA: Insufficient documentation

## 2017-12-18 DIAGNOSIS — R918 Other nonspecific abnormal finding of lung field: Secondary | ICD-10-CM

## 2017-12-18 LAB — CBC WITH DIFFERENTIAL (CANCER CENTER ONLY)
BASOS PCT: 1 %
Basophils Absolute: 0.1 10*3/uL (ref 0.0–0.1)
EOS ABS: 0.1 10*3/uL (ref 0.0–0.5)
EOS PCT: 1 %
HCT: 42.9 % (ref 38.4–49.9)
Hemoglobin: 14 g/dL (ref 13.0–17.1)
Lymphocytes Relative: 11 %
Lymphs Abs: 0.9 10*3/uL (ref 0.9–3.3)
MCH: 25.8 pg — AB (ref 27.2–33.4)
MCHC: 32.6 g/dL (ref 32.0–36.0)
MCV: 79.2 fL — ABNORMAL LOW (ref 79.3–98.0)
MONO ABS: 1 10*3/uL — AB (ref 0.1–0.9)
Monocytes Relative: 12 %
NEUTROS PCT: 75 %
Neutro Abs: 6.1 10*3/uL (ref 1.5–6.5)
PLATELETS: 329 10*3/uL (ref 140–400)
RBC: 5.42 MIL/uL (ref 4.20–5.82)
RDW: 18.2 % — AB (ref 11.0–14.6)
WBC Count: 8.2 10*3/uL (ref 4.0–10.3)

## 2017-12-18 LAB — CMP (CANCER CENTER ONLY)
ALK PHOS: 273 U/L — AB (ref 38–126)
ALT: 57 U/L — AB (ref 0–44)
AST: 106 U/L — AB (ref 15–41)
Albumin: 2.7 g/dL — ABNORMAL LOW (ref 3.5–5.0)
Anion gap: 15 (ref 5–15)
BUN: 24 mg/dL — AB (ref 8–23)
CALCIUM: 9.3 mg/dL (ref 8.9–10.3)
CO2: 20 mmol/L — ABNORMAL LOW (ref 22–32)
CREATININE: 0.75 mg/dL (ref 0.61–1.24)
Chloride: 103 mmol/L (ref 98–111)
GFR, Estimated: >60 mL/min (ref >60)
Glucose, Bld: 115 mg/dL — ABNORMAL HIGH (ref 70–99)
Potassium: 4.1 mmol/L (ref 3.5–5.1)
SODIUM: 138 mmol/L (ref 135–145)
Total Bilirubin: 1.3 mg/dL — ABNORMAL HIGH (ref 0.3–1.2)
Total Protein: 8.1 g/dL (ref 6.5–8.1)

## 2017-12-18 LAB — TSH: TSH: 3.558 u[IU]/mL (ref 0.320–4.118)

## 2017-12-18 MED ORDER — DIPHENHYDRAMINE HCL 25 MG PO CAPS
ORAL_CAPSULE | ORAL | Status: AC
  Filled 2017-12-18: qty 1

## 2017-12-18 MED ORDER — DIPHENHYDRAMINE HCL 25 MG PO TABS
25.0000 mg | ORAL_TABLET | Freq: Once | ORAL | Status: AC
  Administered 2017-12-18: 25 mg via ORAL
  Filled 2017-12-18: qty 1

## 2017-12-18 MED ORDER — FAMOTIDINE IN NACL 20-0.9 MG/50ML-% IV SOLN
INTRAVENOUS | Status: AC
  Filled 2017-12-18: qty 50

## 2017-12-18 MED ORDER — FAMOTIDINE IN NACL 20-0.9 MG/50ML-% IV SOLN
20.0000 mg | Freq: Once | INTRAVENOUS | Status: AC
  Administered 2017-12-18: 20 mg via INTRAVENOUS

## 2017-12-18 MED ORDER — SODIUM CHLORIDE 0.9 % IV SOLN
Freq: Once | INTRAVENOUS | Status: AC
  Administered 2017-12-18: 15:00:00 via INTRAVENOUS
  Filled 2017-12-18: qty 250

## 2017-12-18 MED ORDER — SODIUM CHLORIDE 0.9 % IV SOLN
240.0000 mg | Freq: Once | INTRAVENOUS | Status: AC
  Administered 2017-12-18: 240 mg via INTRAVENOUS
  Filled 2017-12-18: qty 24

## 2017-12-18 NOTE — Progress Notes (Signed)
  [place] OFFICE PROGRESS NOTE   Diagnosis:  Hepatocellular carcinoma  INTERVAL HISTORY:   Mr. Oakley returns as scheduled.  He completed cycle 5 nivolumab 12/04/2017.  He continues to have intermittent nausea.  He is taking Phenergan 3 times a day.  For abdominal pain he takes Percocet typically 3 times a day.  He continues to have a cough.  He has dyspnea on exertion.  No bleeding.  No rash.  He estimates 2 bowel movements a day.  Objective:  Vital signs in last 24 hours:  Blood pressure 104/73, pulse 100, temperature 98.5 F (36.9 C), temperature source Oral, resp. rate 18, height 5' 10.5" (1.791 m), weight 163 lb 11.2 oz (74.3 kg), SpO2 95 %.    HEENT: No thrush or ulcers. Lymphatics: Lungs clear bilaterally. Resp: Lungs clear bilaterally. Cardio: Regular rate and rhythm. GI: Fullness right upper abdomen.  Prominent vein pattern over the right abdomen. Vascular: No leg edema. Neuro: Alert and oriented. Skin: No rash.  Small ecchymoses scattered over abdominal wall.   Lab Results:  Lab Results  Component Value Date   WBC 8.2 12/18/2017   HGB 14.0 12/18/2017   HCT 42.9 12/18/2017   MCV 79.2 (L) 12/18/2017   PLT 329 12/18/2017   NEUTROABS 6.1 12/18/2017    Imaging:  No results found.  Medications: I have reviewed the patient's current medications.  Assessment/Plan: 1. Multifocal hepatocellular carcinoma  Abdominal ultrasound 08/08/2017-multiple mass lesions of varying echogenicity within the liver with the largest within the right lobe appearing to extend into the intrahepatic IVC. Mild ascites right upper quadrant.  08/08/2017 AFP elevated225.9  MRI abdomen 08/11/2017-cirrhosis with multifocal hepatocellular carcinoma, dominant lesion right hepatic lobe 16.5 cm. Dominant lesion invades the IVC with tumor thrombus in the IVC extending up toward and potentially into the lower portion of the right atrium. Mild perihepatic  ascites.  CTs4/16/2019-multifocal hepatocellular carcinoma. Right-sided tumor extension into the IVC and inferior right atrium. Moderate to large volume bilateral pulmonary emboli. Thoracic adenopathy suspicious for metastatic disease. Nonspecific bilateral pulmonary nodules. Similar small volume of abdominopelvic ascites.  Sorafenib started 08/31/2017  Sorafenib placed on hold 09/27/2017  Sorafenib discontinued 10/02/2017  Cycle 1 nivolumab 10/09/2017  Cycle 2 nivolumab 10/23/2017  Cycle 3 nivolumab 11/06/2017  Cycle 4 nivolumab 11/21/2017  Cycle 5 nivolumab 12/04/2017  Cycle 6 nivolumab 12/18/2017 2. Hepatitis C status post treatment with Epclusa 3. Exertional dyspnea probably secondary to COPD, question HCC involving the lungs, question pleural effusion 4. Abdominal pain secondary to #1 5. Bilateral pulmonary emboli on chest CT 08/20/2017. Lovenox initiated 08/21/2017.Lovenox placed on hold 09/27/2017 6. Fall 08/22/2017-hematoma right gluteus 7. Bleeding mass lesion at the right lower gumline 09/27/2017- large right lower gumline ulceration noted 10/02/2017, no remaining mass, no bleeding;status post extraction of 2 teeth 10/08/2017. Ulceration improved 10/09/2017.    Disposition: Mr. Gasior appears unchanged.  He has completed 5 cycles of nivolumab.  Plan to proceed with cycle 6 today as scheduled.  We are referring him for restaging CTs after this cycle.  We reviewed today's labs.  LFTs with stable elevation.  Labs are okay to proceed with treatment.  He will return for lab and follow-up in 2 weeks.  He will contact the office in the interim with any problems.  Plan reviewed with Dr. Benay Spice.    Ned Card ANP/GNP-BC   12/18/2017  2:22 PM

## 2017-12-18 NOTE — Patient Instructions (Signed)
[facility] Discharge Instructions for Patients Receiving Chemotherapy  Today you received the following chemotherapy agents:  Opdivo (nivolumab)  To help prevent nausea and vomiting after your treatment, we encourage you to take your nausea medication as prescribed.   If you develop nausea and vomiting that is not controlled by your nausea medication, call the clinic.   BELOW ARE SYMPTOMS THAT SHOULD BE REPORTED IMMEDIATELY:  *FEVER GREATER THAN 100.5 F  *CHILLS WITH OR WITHOUT FEVER  NAUSEA AND VOMITING THAT IS NOT CONTROLLED WITH YOUR NAUSEA MEDICATION  *UNUSUAL SHORTNESS OF BREATH  *UNUSUAL BRUISING OR BLEEDING  TENDERNESS IN MOUTH AND THROAT WITH OR WITHOUT PRESENCE OF ULCERS  *URINARY PROBLEMS  *BOWEL PROBLEMS  UNUSUAL RASH Items with * indicate a potential emergency and should be followed up as soon as possible.  Feel free to call the clinic should you have any questions or concerns. The clinic phone number is [phone].  Please show the CHEMO ALERT CARD at check-in to the Emergency Department and triage nurse.   

## 2017-12-18 NOTE — Progress Notes (Signed)
Per Ned Card, NP / Dr Benay Spice OK for Opdivo with AST of 106

## 2017-12-19 LAB — AFP TUMOR MARKER: AFP, Serum, Tumor Marker: 460 ng/mL — ABNORMAL HIGH (ref 0.0–8.3)

## 2017-12-23 ENCOUNTER — Other Ambulatory Visit: Payer: Self-pay

## 2017-12-23 DIAGNOSIS — C220 Liver cell carcinoma: Secondary | ICD-10-CM

## 2017-12-23 MED ORDER — OXYCODONE-ACETAMINOPHEN 5-325 MG PO TABS: 1.0000 | ORAL_TABLET | Freq: Four times a day (QID) | ORAL | 0 refills | Status: AC | PRN

## 2017-12-23 NOTE — Progress Notes (Signed)
Informed pt that oxycodone script paper is available for pick up in office

## 2017-12-29 ENCOUNTER — Other Ambulatory Visit: Payer: Self-pay | Admitting: Oncology

## 2017-12-31 ENCOUNTER — Ambulatory Visit (HOSPITAL_COMMUNITY)
Admission: RE | Admit: 2017-12-31 | Discharge: 2017-12-31 | Disposition: A | Discharge status: Home / Self Care | Payer: Medicare Other | Source: Ambulatory Visit | Attending: Nurse Practitioner | Admitting: Nurse Practitioner

## 2017-12-31 DIAGNOSIS — R918 Other nonspecific abnormal finding of lung field: Secondary | ICD-10-CM | POA: Insufficient documentation

## 2017-12-31 DIAGNOSIS — C220 Liver cell carcinoma: Secondary | ICD-10-CM | POA: Insufficient documentation

## 2017-12-31 MED ORDER — IOHEXOL 300 MG/ML  SOLN
100.0000 mL | Freq: Once | INTRAMUSCULAR | Status: AC | PRN
  Administered 2017-12-31: 100 mL via INTRAVENOUS

## 2018-01-01 ENCOUNTER — Inpatient Hospital Stay (HOSPITAL_BASED_OUTPATIENT_CLINIC_OR_DEPARTMENT_OTHER): Payer: Medicare Other | Admitting: Oncology

## 2018-01-01 ENCOUNTER — Inpatient Hospital Stay: Payer: Medicare Other

## 2018-01-01 ENCOUNTER — Other Ambulatory Visit: Payer: Self-pay

## 2018-01-01 VITALS — BP 129/94 | HR 100 | Temp 97.7°F | Resp 18 | Ht 70.5 in | Wt 161.2 lb

## 2018-01-01 DIAGNOSIS — R0600 Dyspnea, unspecified: Secondary | ICD-10-CM

## 2018-01-01 DIAGNOSIS — G893 Neoplasm related pain (acute) (chronic): Secondary | ICD-10-CM

## 2018-01-01 DIAGNOSIS — I2699 Other pulmonary embolism without acute cor pulmonale: Secondary | ICD-10-CM

## 2018-01-01 DIAGNOSIS — R918 Other nonspecific abnormal finding of lung field: Secondary | ICD-10-CM

## 2018-01-01 DIAGNOSIS — R110 Nausea: Secondary | ICD-10-CM

## 2018-01-01 DIAGNOSIS — C220 Liver cell carcinoma: Secondary | ICD-10-CM

## 2018-01-01 DIAGNOSIS — Z7901 Long term (current) use of anticoagulants: Secondary | ICD-10-CM

## 2018-01-01 DIAGNOSIS — R197 Diarrhea, unspecified: Secondary | ICD-10-CM

## 2018-01-01 DIAGNOSIS — Z5112 Encounter for antineoplastic immunotherapy: Secondary | ICD-10-CM | POA: Diagnosis not present

## 2018-01-01 LAB — CMP (CANCER CENTER ONLY)
ALT: 54 U/L — ABNORMAL HIGH (ref 0–44)
ANION GAP: 10 (ref 5–15)
AST: 125 U/L — ABNORMAL HIGH (ref 15–41)
Albumin: 2.6 g/dL — ABNORMAL LOW (ref 3.5–5.0)
Alkaline Phosphatase: 276 U/L — ABNORMAL HIGH (ref 38–126)
BILIRUBIN TOTAL: 1 mg/dL (ref 0.3–1.2)
BUN: 23 mg/dL (ref 8–23)
CO2: 25 mmol/L (ref 22–32)
Calcium: 9.7 mg/dL (ref 8.9–10.3)
Chloride: 105 mmol/L (ref 98–111)
Creatinine: 0.73 mg/dL (ref 0.61–1.24)
GFR, Est AFR Am: >60 mL/min (ref >60)
GFR, Estimated: >60 mL/min (ref >60)
GLUCOSE: 107 mg/dL — AB (ref 70–99)
POTASSIUM: 3.7 mmol/L (ref 3.5–5.1)
Sodium: 140 mmol/L (ref 135–145)
TOTAL PROTEIN: 7.9 g/dL (ref 6.5–8.1)

## 2018-01-01 LAB — CBC WITH DIFFERENTIAL (CANCER CENTER ONLY)
BASOS ABS: 0.1 10*3/uL (ref 0.0–0.1)
Basophils Relative: 1 %
Eosinophils Absolute: 0.1 10*3/uL (ref 0.0–0.5)
Eosinophils Relative: 1 %
HEMATOCRIT: 42 % (ref 38.4–49.9)
HEMOGLOBIN: 14 g/dL (ref 13.0–17.1)
LYMPHS PCT: 11 %
Lymphs Abs: 0.9 10*3/uL (ref 0.9–3.3)
MCH: 26 pg — ABNORMAL LOW (ref 27.2–33.4)
MCHC: 33.3 g/dL (ref 32.0–36.0)
MCV: 78.1 fL — AB (ref 79.3–98.0)
MONO ABS: 0.8 10*3/uL (ref 0.1–0.9)
Monocytes Relative: 10 %
NEUTROS ABS: 6.1 10*3/uL (ref 1.5–6.5)
NEUTROS PCT: 77 %
Platelet Count: 335 10*3/uL (ref 140–400)
RBC: 5.38 MIL/uL (ref 4.20–5.82)
RDW: 18 % — ABNORMAL HIGH (ref 11.0–14.6)
WBC: 8 10*3/uL (ref 4.0–10.3)

## 2018-01-01 NOTE — Progress Notes (Signed)
[place] OFFICE PROGRESS NOTE   Diagnosis: Hepatocellular carcinoma  INTERVAL HISTORY:   [name] completed another treatment with nivolumab on 12/18/2017.  He reports increased abdominal pain and nausea.  He has intermittent diarrhea.  Phenergan helps the nausea.  He continues Lovenox anticoagulation.  He has decided to discontinue treatment for the hepatocellular carcinoma.  Objective:  Vital signs in last 24 hours:  Blood pressure (!) 129/94, pulse 100, temperature 97.7 F (36.5 C), temperature source Oral, resp. rate 18, height 5' 10.5" (1.791 m), weight 161 lb 3.2 oz (73.1 kg), SpO2 97 %.    HEENT: No thrush or ulcers Resp: Decreased breath sounds at the lower chest bilaterally Cardio: Regular rate and rhythm GI: Mildly distended, the liver edge is palpable in the right upper abdomen Vascular: No leg edema  Lab Results:  Lab Results  Component Value Date   WBC 8.0 01/01/2018   HGB 14.0 01/01/2018   HCT 42.0 01/01/2018   MCV 78.1 (L) 01/01/2018   PLT 335 01/01/2018   NEUTROABS 6.1 01/01/2018    CMP  Lab Results  Component Value Date   NA 140 01/01/2018   K 3.7 01/01/2018   CL 105 01/01/2018   CO2 25 01/01/2018   GLUCOSE 107 (H) 01/01/2018   BUN 23 01/01/2018   CREATININE 0.73 01/01/2018   CALCIUM 9.7 01/01/2018   PROT 7.9 01/01/2018   ALBUMIN 2.6 (L) 01/01/2018   AST 125 (H) 01/01/2018   ALT 54 (H) 01/01/2018   ALKPHOS 276 (H) 01/01/2018   BILITOT 1.0 01/01/2018   GFRNONAA >60 01/01/2018   GFRAA >60 01/01/2018    No results found for: CEA1  Lab Results  Component Value Date   INR 1.06 09/27/2017    Imaging:  Ct Chest W Contrast  Result Date: 12/31/2017 CLINICAL DATA:  Hepatocellular carcinoma. Diagnosis April 2019. Immunotherapy started July 2019. Hepatitis-C. EXAM: CT CHEST, ABDOMEN, AND PELVIS WITH CONTRAST TECHNIQUE: Multidetector CT imaging of the chest, abdomen and pelvis was performed following the standard protocol  during bolus administration of intravenous contrast. CONTRAST:  152mL OMNIPAQUE IOHEXOL 300 MG/ML  SOLN COMPARISON:  CT 08/20/2017 FINDINGS: CT CHEST FINDINGS Cardiovascular: No significant vascular findings. Normal heart size. No pericardial effusion. The LEFT lobe pulmonary embolism is decreased in volume compared to prior (image 51/2). Tumor extension into the RIGHT atrium (image 78/2 similar. Mediastinum/Nodes: Enlarged prevascular lymph node measuring 19 mm not changed from 18 mm. No pericardial effusion. Lungs/Pleura: Branching nodular pattern in the anterior aspect of the RIGHT lower lobe (image 95/3 is increased in prominence. Rounded nodule in the LEFT lower lobe measuring 6 mm (image 101/3) is increased from 4 mm. Musculoskeletal: No aggressive osseous lesion. CT ABDOMEN AND PELVIS FINDINGS Hepatobiliary: Extensive involvement of the RIGHT hepatic lobe by infiltrative hepatocellular carcinoma measuring 17 cm by 14 cm (image 93/2) compared to 16.5 x 13.3. Large lesion in the LEFT hepatic lobe centrally measuring 7.6 x 6.8 cm compares to 6.5 x 7 6.9 cm. New lesion in the lateral aspect of the LEFT hepatic lobe measures 1.8 cm (image 111/2). New lesion tip of the LEFT lateral hepatic lobe on image 102/2. No biliary duct dilatation. Small amount of fluid surrounds the RIGHT hepatic margin Again noted tumor extension into the IVC (image 79/2). Pancreas: Pancreas is normal. No ductal dilatation. No pancreatic inflammation. Spleen: Normal spleen Adrenals/urinary tract: Adrenal glands and kidneys are normal. The ureters and bladder normal. Stomach/Bowel: Stomach, small-bowel and appendix are normal. The colon normal. There  are diverticular the descending colon without acute inflammation. Small a fluid the pelvis Vascular/Lymphatic: Abdominal aorta is normal caliber with atherosclerotic calcification. There is no retroperitoneal or periportal lymphadenopathy. No pelvic lymphadenopathy. Reproductive: Prostate small  Other: No peritoneal metastasis Musculoskeletal: No aggressive osseous lesion. IMPRESSION: Chest Impression: 1. Increase in prominence of branching nodular pattern in the RIGHT lower lobe and enlargement of small nodule LEFT lower lobe concerning for pulmonary metastasis. Recommend close attention on follow-up. 2. Stable metastatic prevascular mediastinal lymph node. 3. Stable tumor extension from the liver into the RIGHT atrium. Abdomen / Pelvis Impression: 1. Interval mild-to-moderate progression of hepatocellular carcinoma involving the liver. Multiple new small lesions in the less affected LEFT hepatic lobe. The RIGHT hepatic lobe is near completely tumor replaced. Larger lesion slightly increased additionally. 2. Stable tumor extension into the IVC. 3. No evidence of peritoneal metastasis or lymphadenopathy. Electronically Signed   By: Suzy Bouchard M.D.   On: 12/31/2017 16:31   Ct Abdomen Pelvis W Contrast  Result Date: 12/31/2017 CLINICAL DATA:  Hepatocellular carcinoma. Diagnosis April 2019. Immunotherapy started July 2019. Hepatitis-C. EXAM: CT CHEST, ABDOMEN, AND PELVIS WITH CONTRAST TECHNIQUE: Multidetector CT imaging of the chest, abdomen and pelvis was performed following the standard protocol during bolus administration of intravenous contrast. CONTRAST:  164mL OMNIPAQUE IOHEXOL 300 MG/ML  SOLN COMPARISON:  CT 08/20/2017 FINDINGS: CT CHEST FINDINGS Cardiovascular: No significant vascular findings. Normal heart size. No pericardial effusion. The LEFT lobe pulmonary embolism is decreased in volume compared to prior (image 51/2). Tumor extension into the RIGHT atrium (image 78/2 similar. Mediastinum/Nodes: Enlarged prevascular lymph node measuring 19 mm not changed from 18 mm. No pericardial effusion. Lungs/Pleura: Branching nodular pattern in the anterior aspect of the RIGHT lower lobe (image 95/3 is increased in prominence. Rounded nodule in the LEFT lower lobe measuring 6 mm (image 101/3) is  increased from 4 mm. Musculoskeletal: No aggressive osseous lesion. CT ABDOMEN AND PELVIS FINDINGS Hepatobiliary: Extensive involvement of the RIGHT hepatic lobe by infiltrative hepatocellular carcinoma measuring 17 cm by 14 cm (image 93/2) compared to 16.5 x 13.3. Large lesion in the LEFT hepatic lobe centrally measuring 7.6 x 6.8 cm compares to 6.5 x 7 6.9 cm. New lesion in the lateral aspect of the LEFT hepatic lobe measures 1.8 cm (image 111/2). New lesion tip of the LEFT lateral hepatic lobe on image 102/2. No biliary duct dilatation. Small amount of fluid surrounds the RIGHT hepatic margin Again noted tumor extension into the IVC (image 79/2). Pancreas: Pancreas is normal. No ductal dilatation. No pancreatic inflammation. Spleen: Normal spleen Adrenals/urinary tract: Adrenal glands and kidneys are normal. The ureters and bladder normal. Stomach/Bowel: Stomach, small-bowel and appendix are normal. The colon normal. There are diverticular the descending colon without acute inflammation. Small a fluid the pelvis Vascular/Lymphatic: Abdominal aorta is normal caliber with atherosclerotic calcification. There is no retroperitoneal or periportal lymphadenopathy. No pelvic lymphadenopathy. Reproductive: Prostate small Other: No peritoneal metastasis Musculoskeletal: No aggressive osseous lesion. IMPRESSION: Chest Impression: 1. Increase in prominence of branching nodular pattern in the RIGHT lower lobe and enlargement of small nodule LEFT lower lobe concerning for pulmonary metastasis. Recommend close attention on follow-up. 2. Stable metastatic prevascular mediastinal lymph node. 3. Stable tumor extension from the liver into the RIGHT atrium. Abdomen / Pelvis Impression: 1. Interval mild-to-moderate progression of hepatocellular carcinoma involving the liver. Multiple new small lesions in the less affected LEFT hepatic lobe. The RIGHT hepatic lobe is near completely tumor replaced. Larger lesion slightly increased  additionally. 2.  Stable tumor extension into the IVC. 3. No evidence of peritoneal metastasis or lymphadenopathy. Electronically Signed   By: Suzy Bouchard M.D.   On: 12/31/2017 16:31   CT images reviewed Medications: I have reviewed the patient's current medications.   Assessment/Plan: 1. Multifocal hepatocellular carcinoma  Abdominal ultrasound 08/08/2017-multiple mass lesions of varying echogenicity within the liver with the largest within the right lobe appearing to extend into the intrahepatic IVC. Mild ascites right upper quadrant.  08/08/2017 AFP elevated225.9  MRI abdomen 08/11/2017-cirrhosis with multifocal hepatocellular carcinoma, dominant lesion right hepatic lobe 16.5 cm. Dominant lesion invades the IVC with tumor thrombus in the IVC extending up toward and potentially into the lower portion of the right atrium. Mild perihepatic ascites.  CTs4/16/2019-multifocal hepatocellular carcinoma. Right-sided tumor extension into the IVC and inferior right atrium. Moderate to large volume bilateral pulmonary emboli. Thoracic adenopathy suspicious for metastatic disease. Nonspecific bilateral pulmonary nodules. Similar small volume of abdominopelvic ascites.  Sorafenib started 08/31/2017  Sorafenib placed on hold 09/27/2017  Sorafenib discontinued 10/02/2017  Cycle 1 nivolumab 10/09/2017  Cycle 2 nivolumab 10/23/2017  Cycle 3 nivolumab 11/06/2017  Cycle 4 nivolumab 11/21/2017  Cycle 5 nivolumab 12/04/2017  Cycle 6 nivolumab 12/18/2017  CTs 12/31/2017- increased branching nodular pattern in the right lower lobe and enlargement of a left lower lobe nodule, stable extension of tumor into the right atrium, multiple new small liver lesions, mild progression of tumor in the right liver 2. Hepatitis C status post treatment with Epclusa 3. Exertional dyspnea probably secondary to COPD, question HCC involving the lungs, question pleural effusion 4. Abdominal pain secondary to  #1 5. Bilateral pulmonary emboli on chest CT 08/20/2017. Lovenox initiated 08/21/2017.Lovenox placed on hold 09/27/2017 6. Fall 08/22/2017-hematoma right gluteus 7. Bleeding mass lesion at the right lower gumline 09/27/2017- large right lower gumline ulceration noted 10/02/2017, no remaining mass, no bleeding;status post extraction of 2 teeth 10/08/2017. Ulceration improved 10/09/2017.     Disposition:  Wayne Rose has hepatocellular carcinoma.  He has been treated with nivolumab since early June.  There is clinical and CT evidence of disease progression.  We discussed treatment options including supportive care versus a trial of lenvatinib.  He would like to continue therapy.  Agrees to a [facility] hospice referral for home care.  We discussed CPR and ACLS issues.  He will be placed on a no CODE BLUE status.  He will contemplate dictation to continue anti-coagulation therapy given his prognosis and decision on hospice care.  He will return for an office visit in approximately 3 weeks.    Betsy Coder, MD  01/01/2018  11:16 AM

## 2018-01-02 ENCOUNTER — Telehealth: Payer: Self-pay | Admitting: Oncology

## 2018-01-02 NOTE — Telephone Encounter (Signed)
Scheduled appt per 8/28 los - sent reminder letter in the mail with appt date and time.  

## 2018-02-03 ENCOUNTER — Inpatient Hospital Stay: Payer: Medicare Other | Admitting: Nurse Practitioner

## 2018-02-03 ENCOUNTER — Telehealth: Payer: Self-pay | Admitting: *Deleted

## 2018-02-04 NOTE — Telephone Encounter (Signed)
Notification from [facility] patient passed away 02/05/2018 at 926am. MD advised. Appts cancelled.

## 2018-02-04 DEATH — deceased

## 2018-12-06 IMAGING — CT CT HIP*R* W/O CM
2 of 3 series · 16 of 46 positions shown, 18 images · non-contrast
Comparison: CT scan 08/20/2017

CLINICAL DATA: Fell 08/23/2017.  Persistent right hip pain.

EXAM:
CT OF THE RIGHT HIP WITHOUT CONTRAST
TECHNIQUE: Multidetector CT imaging of the right hip was performed according to
the standard protocol. Multiplanar CT image reconstructions were
also generated.

[Series 3: axial st · axial · 0.60mm/px · z∈[+804,+1026]mm · 13 of 129 slices shown, 15 images]
[im 9/129  soft-tissue]
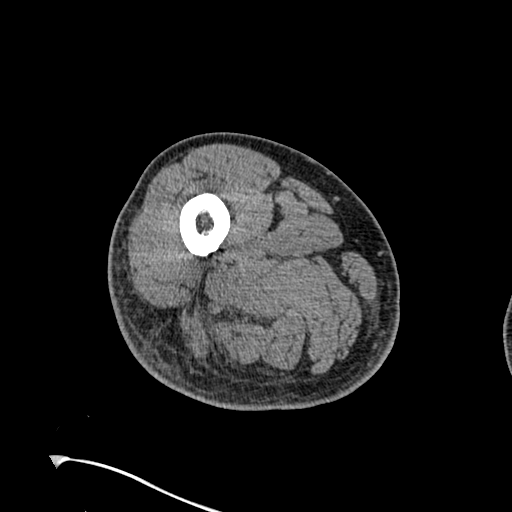
[im 9/129  bone]
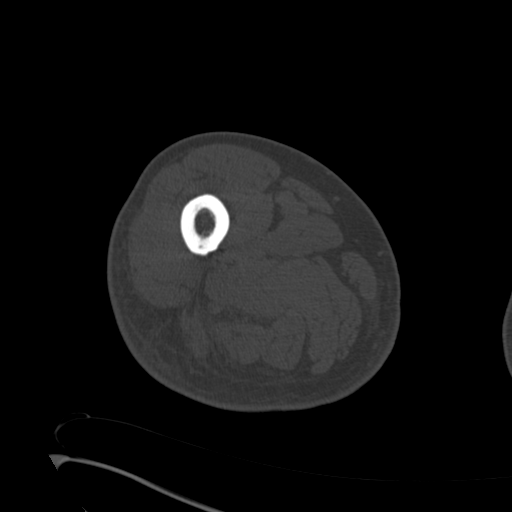
[im 17/129  soft-tissue]
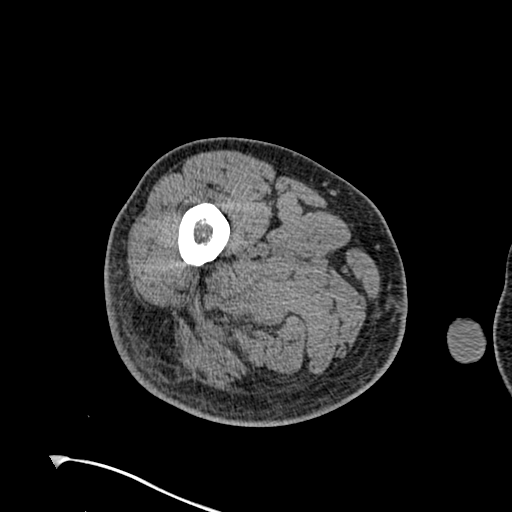
[im 25/129  soft-tissue]
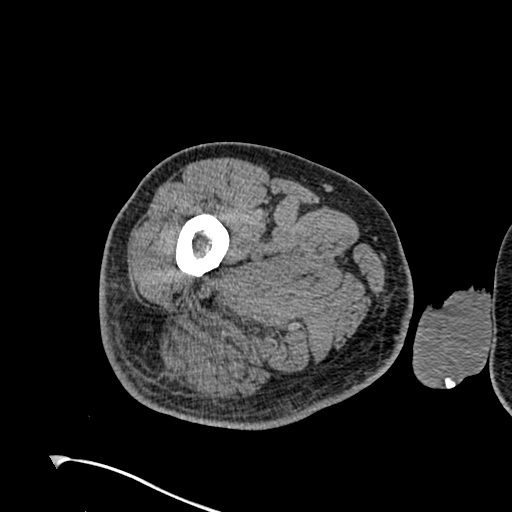
[im 38/129  soft-tissue]
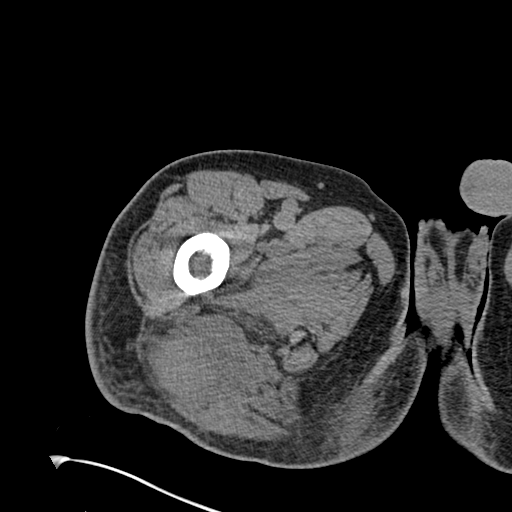
[im 46/129  soft-tissue]
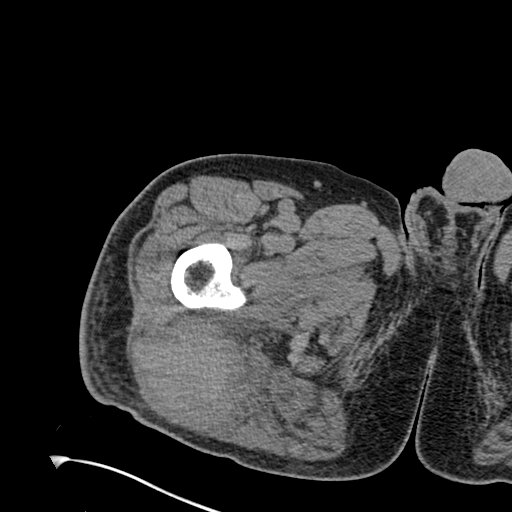
[im 54/129  soft-tissue]
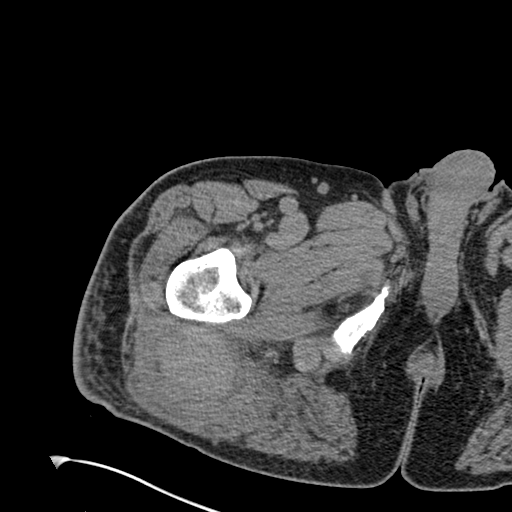
[im 67/129  soft-tissue]
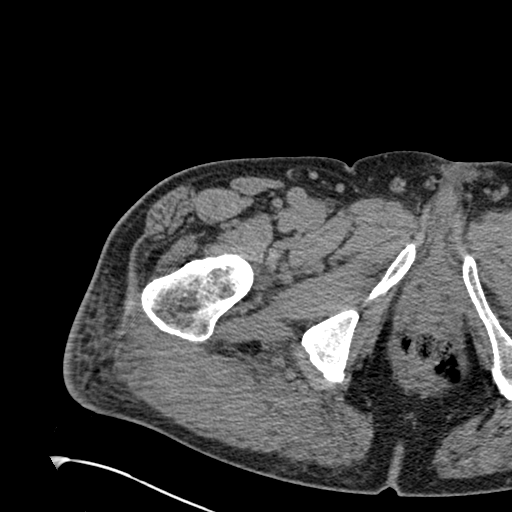
[im 75/129  soft-tissue]
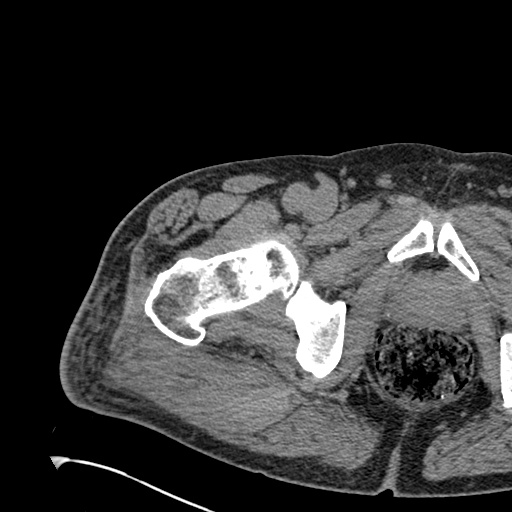
[im 83/129  soft-tissue]
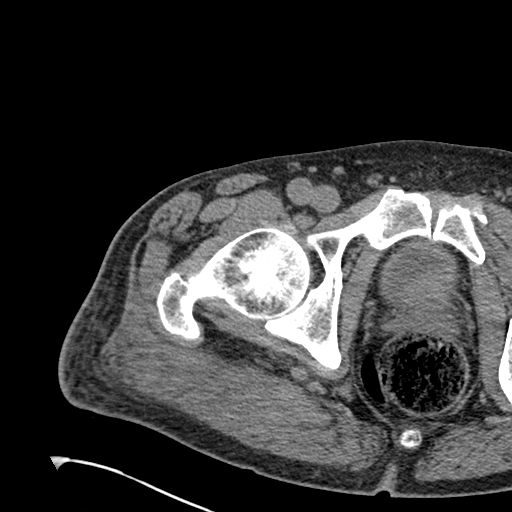
[im 83/129  bone]
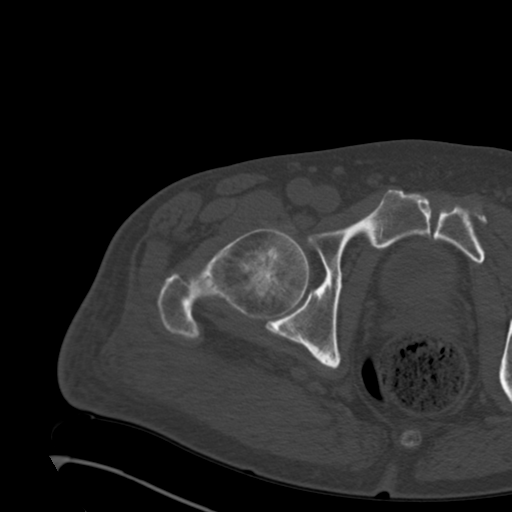
[im 91/129  soft-tissue]
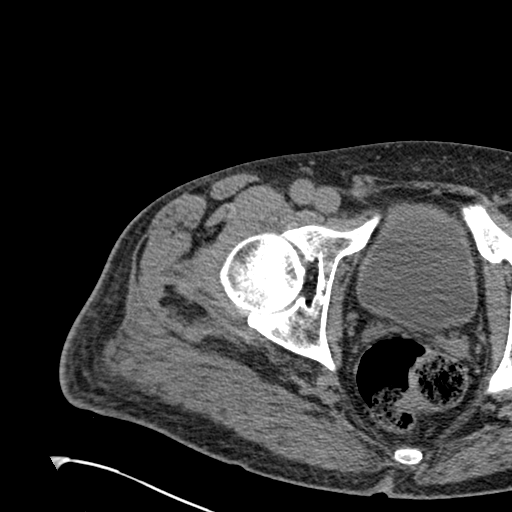
[im 104/129  soft-tissue]
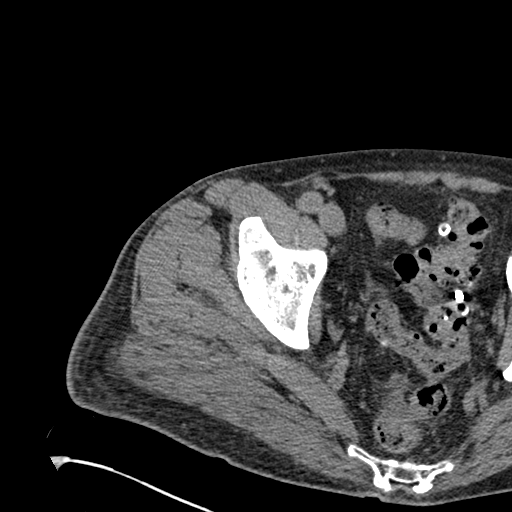
[im 112/129  soft-tissue]
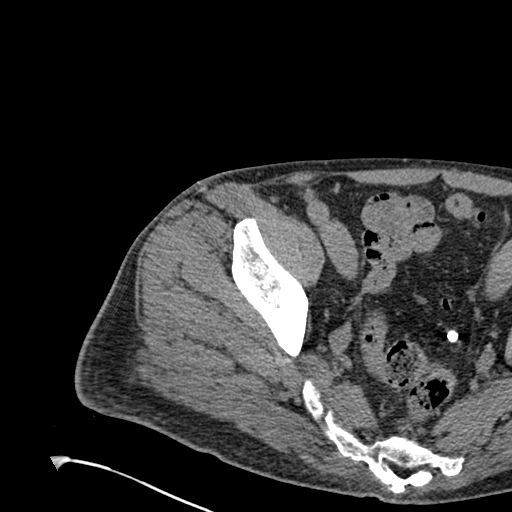
[im 120/129  soft-tissue]
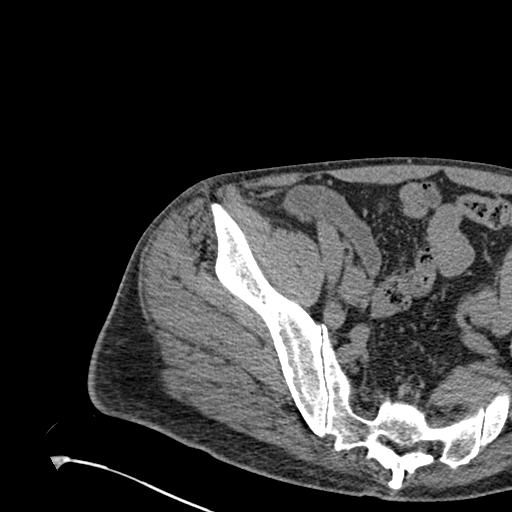

[Series 8: coronal st · coronal · 0.42mm/px · 3 of 90 slices shown]
[im 30/90  soft-tissue]
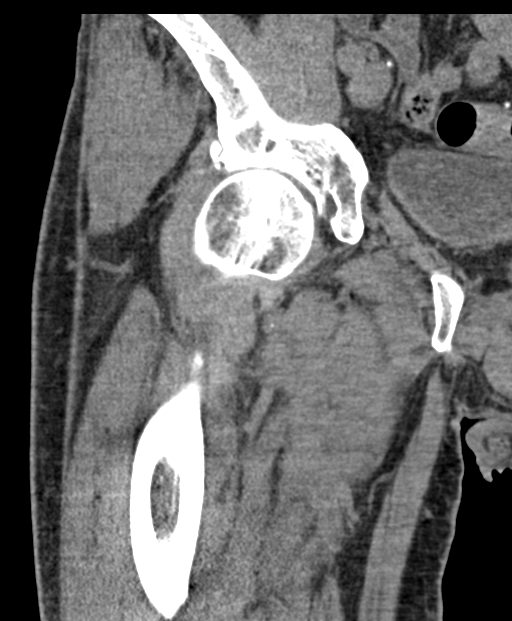
[im 40/90  soft-tissue]
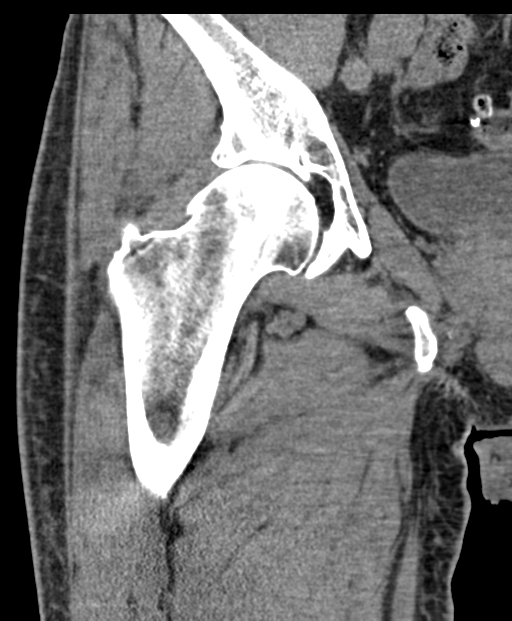
[im 50/90  soft-tissue]
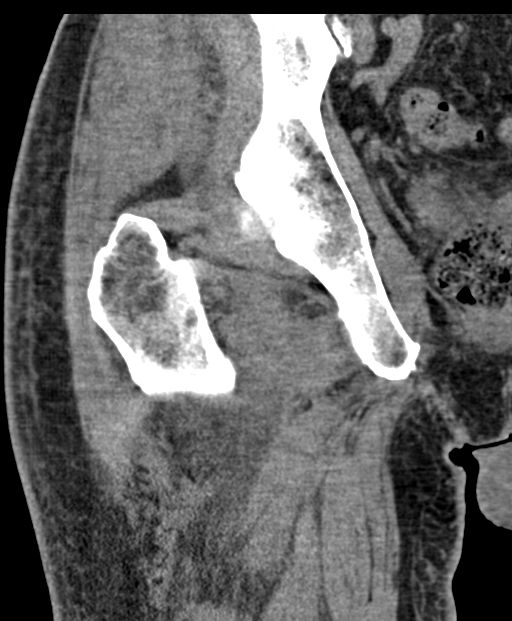

[16 of 46 positions shown; findings below may reference images not displayed]

FINDINGS: The right hip is normally located. Moderate to advanced right hip
joint degenerative changes with joint space narrowing, osteophytic
spurring and subchondral cystic change. No acute fracture or
evidence of AVN. The visualized right hemipelvis is intact. The
right SI joint appears normal. No right-sided pubic rami fractures
are identified.

There is a large rounded area of increased attenuation in the
lateral aspect of the gluteus maximus muscle which is most likely a
hematoma related to the patient's fall. There is also associated
inflammation/edema and fluid in the overlying subcutaneous fat. The
hematoma measures approximately 6.9 x 6.8 x 6.5 cm. It likely
accounts for the patient's right hip pain.
IMPRESSION: 1. 6.9 x 6.8 x 6.5 cm intramuscular hematoma in the lateral aspect
of the gluteus maximus muscle likely accounting for the patient's
hip pain.
2. No hip or right-sided pelvic fractures.
3. No significant intra pelvic abnormalities.
4. Moderate to advanced right hip joint degenerative changes.

## 2023-02-18 NOTE — Telephone Encounter (Signed)
error
